# Patient Record
Sex: Female | Born: 1975
Health system: Southern US, Community
[De-identification: ages and names within clinical notes are randomized; demographics above are authoritative.]

## PROBLEM LIST (undated history)

## (undated) DIAGNOSIS — N63 Unspecified lump in unspecified breast: Secondary | ICD-10-CM

## (undated) DIAGNOSIS — N6019 Diffuse cystic mastopathy of unspecified breast: Secondary | ICD-10-CM

## (undated) HISTORY — DX: Unspecified lump in unspecified breast: N63.0

## (undated) HISTORY — DX: Diffuse cystic mastopathy of unspecified breast: N60.19

---

## 2001-10-27 HISTORY — PX: OTHER SURGICAL HISTORY: SHX169

## 2001-10-27 HISTORY — PX: LASIK: SHX215

## 2005-09-15 ENCOUNTER — Observation Stay: Payer: Self-pay | Admitting: Obstetrics & Gynecology

## 2005-10-06 ENCOUNTER — Inpatient Hospital Stay: Payer: Self-pay

## 2007-10-16 ENCOUNTER — Inpatient Hospital Stay: Payer: Self-pay

## 2010-10-27 DIAGNOSIS — N63 Unspecified lump in unspecified breast: Secondary | ICD-10-CM

## 2010-10-27 HISTORY — DX: Unspecified lump in unspecified breast: N63.0

## 2011-04-03 ENCOUNTER — Ambulatory Visit: Payer: Self-pay | Admitting: General Surgery

## 2013-01-26 ENCOUNTER — Inpatient Hospital Stay: Payer: Self-pay

## 2013-01-26 LAB — CBC WITH DIFFERENTIAL/PLATELET
Eosinophil #: 0.1 10*3/uL (ref 0.0–0.7)
Eosinophil %: 0.8 %
HCT: 34.2 % — ABNORMAL LOW (ref 35.0–47.0)
HGB: 11.4 g/dL — ABNORMAL LOW (ref 12.0–16.0)
Lymphocyte #: 1.2 10*3/uL (ref 1.0–3.6)
Lymphocyte %: 16 %
MCH: 30.2 pg (ref 26.0–34.0)
MCHC: 33.5 g/dL (ref 32.0–36.0)
MCV: 90 fL (ref 80–100)
Monocyte #: 0.7 x10 3/mm (ref 0.2–0.9)
Monocyte %: 10.1 %
Neutrophil #: 5.2 10*3/uL (ref 1.4–6.5)
Neutrophil %: 72 %
RBC: 3.79 10*6/uL — ABNORMAL LOW (ref 3.80–5.20)
RDW: 13.2 % (ref 11.5–14.5)

## 2013-01-27 LAB — HEMATOCRIT: HCT: 26 % — ABNORMAL LOW (ref 35.0–47.0)

## 2013-02-07 ENCOUNTER — Encounter: Payer: Self-pay | Admitting: General Surgery

## 2013-03-31 ENCOUNTER — Ambulatory Visit: Payer: Self-pay | Admitting: General Surgery

## 2013-04-04 ENCOUNTER — Ambulatory Visit: Payer: Self-pay | Admitting: General Surgery

## 2013-05-04 ENCOUNTER — Encounter: Payer: Self-pay | Admitting: *Deleted

## 2014-10-27 HISTORY — PX: OTHER SURGICAL HISTORY: SHX169

## 2015-01-03 ENCOUNTER — Ambulatory Visit: Payer: BLUE CROSS/BLUE SHIELD

## 2015-01-03 ENCOUNTER — Encounter: Payer: Self-pay | Admitting: General Surgery

## 2015-01-03 ENCOUNTER — Ambulatory Visit (INDEPENDENT_AMBULATORY_CARE_PROVIDER_SITE_OTHER): Payer: BLUE CROSS/BLUE SHIELD | Admitting: General Surgery

## 2015-01-03 VITALS — BP 118/78 | HR 68 | Resp 12 | Ht 62.5 in | Wt 116.0 lb

## 2015-01-03 DIAGNOSIS — N63 Unspecified lump in unspecified breast: Secondary | ICD-10-CM

## 2015-01-03 NOTE — Progress Notes (Signed)
Patient ID: Amanda Graham, female   DOB: May 25, 1976, 39 y.o.   MRN: [id number]  Chief Complaint  Patient presents with  . Follow-up    breast     HPI Amanda Graham is a 39 y.o. female here today for a follow up breast check. Patient missed her appointment in 2014 -had a pregnancy and was uneventful.  She states she noticed a lump in the left breast on her breast exam in the beginning of the month. The area has gotten smaller since she noticed it. She has some tenderness to the touch. She also has decided to have breast augmentation done in the near future.  Pt has had prior breast cyst removed from left breast near nipple.  HPI  Past Medical History  Diagnosis Date  . Diffuse cystic mastopathy   . Lump or mass in breast 2012    Right breast    Past Surgical History  Procedure Laterality Date  . [place] gland  2003    I&D  . Lasik  2003    History reviewed. No pertinent family history.  Social History History  Substance Use Topics  . Smoking status: Never Smoker   . Smokeless tobacco: Never Used  . Alcohol Use: 0.0 oz/week    0 Standard drinks or equivalent per week    No Known Allergies  No current outpatient prescriptions on file.   No current facility-administered medications for this visit.    Review of Systems Review of Systems  Constitutional: Negative.   Respiratory: Negative.   Cardiovascular: Negative.     Blood pressure 118/78, pulse 68, resp. rate 12, height 5' 2.5" (1.588 m), weight 116 lb (52.617 kg).  Physical Exam Physical Exam  Constitutional: She is oriented to person, place, and time. She appears well-developed and well-nourished.  Eyes: Conjunctivae are normal. No scleral icterus.  Neck: Neck supple. No thyromegaly present.  Cardiovascular: Normal rate, regular rhythm and normal heart sounds.   No murmur heard. Pulmonary/Chest: Effort normal and breath sounds normal. Right breast exhibits no inverted nipple, no mass, no nipple discharge, no  skin change and no tenderness. Left breast exhibits mass (1 cm 8 o'clock 2 cm from nipple. ). Left breast exhibits no inverted nipple, no nipple discharge, no skin change and no tenderness.  Lymphadenopathy:    She has no cervical adenopathy.    She has no axillary adenopathy.  Neurological: She is alert and oriented to person, place, and time.  Skin: Skin is warm and dry.  The mass in left breast feels more  Like a fibrotic ridge.   Data Reviewed Targeted US of the mass shows no finding.  Assessment    Left breast mass 1 cm in size 8 o'clock 2 cm from nipple- likely area of fibrosis     Plan    Patient to return in 2-3weeks for a follow up. She is to be scheduled for a bilateral screening mammogram at this time.     cc: Amanda Pink, MD, Amanda Graham, CNM   [facility] G 01/03/2015, 11:06 AM

## 2015-01-03 NOTE — Patient Instructions (Signed)
Patient to return in 1 month for follow up. She is also to be arranged for a bilateral screening mammogram at this time. Continue self breast exams. Call office for any new breast issues or concerns.

## 2015-01-10 ENCOUNTER — Ambulatory Visit: Payer: Self-pay | Admitting: General Surgery

## 2015-01-10 ENCOUNTER — Encounter: Payer: Self-pay | Admitting: General Surgery

## 2015-01-17 ENCOUNTER — Ambulatory Visit: Payer: BLUE CROSS/BLUE SHIELD | Admitting: General Surgery

## 2015-01-17 ENCOUNTER — Ambulatory Visit (INDEPENDENT_AMBULATORY_CARE_PROVIDER_SITE_OTHER): Payer: BLUE CROSS/BLUE SHIELD | Admitting: General Surgery

## 2015-01-17 ENCOUNTER — Encounter: Payer: Self-pay | Admitting: General Surgery

## 2015-01-17 VITALS — BP 118/62 | HR 70 | Resp 12 | Ht 62.0 in | Wt 117.0 lb

## 2015-01-17 DIAGNOSIS — N63 Unspecified lump in unspecified breast: Secondary | ICD-10-CM

## 2015-01-17 NOTE — Progress Notes (Signed)
Patient ID: Amanda Graham, female   DOB: 1976-06-07, 39 y.o.   MRN: [id number]  Chief Complaint  Patient presents with  . Follow-up    mammogram    HPI Amanda Graham is a 39 y.o. female who presents for a follow up after mammogram on 01/10/15. She states last week the left breast was a little sore but now it is "fine".   She was noted to have a 1 cm mass in the left breast recently and the ultrasound was normal.  HPI  Past Medical History  Diagnosis Date  . Diffuse cystic mastopathy   . Lump or mass in breast 2012    Right breast    Past Surgical History  Procedure Laterality Date  . [place] gland  2003    I&D  . Lasik  2003    History reviewed. No pertinent family history.  Social History History  Substance Use Topics  . Smoking status: Never Smoker   . Smokeless tobacco: Never Used  . Alcohol Use: 0.0 oz/week    0 Standard drinks or equivalent per week    No Known Allergies  Current Outpatient Prescriptions  Medication Sig Dispense Refill  . ibuprofen (ADVIL,MOTRIN) 200 MG tablet Take 200 mg by mouth every 6 (six) hours as needed.     No current facility-administered medications for this visit.    Review of Systems Review of Systems  Constitutional: Negative.   Respiratory: Negative.   Cardiovascular: Negative.     Blood pressure 118/62, pulse 70, resp. rate 12, height 5\' 2"  (1.575 m), weight 117 lb (53.071 kg).  Physical Exam Physical Exam  Constitutional: She is oriented to person, place, and time. She appears well-developed and well-nourished.  Eyes: Conjunctivae are normal. No scleral icterus.  Neck: Neck supple.  Pulmonary/Chest: Right breast exhibits mass. Right breast exhibits no inverted nipple, no nipple discharge, no skin change and no tenderness.  1 cm mass 8 o'clock left breast 2 CFN.  Lymphadenopathy:    She has no axillary adenopathy.  Neurological: She is alert and oriented to person, place, and time.  Skin: Skin is warm and dry.     Data Reviewed Mammogram reviewed and is normal. Assessment    1 cm mass 8 o'clock left breast 2 CFN. Given the fact that the patient is going to have implants, feel it is reasonable to remove the lump prior to the implants. Reasons exlained. Discussed fully with patient and she is agreeable. Procedure can be done in office setting.    Plan    Recommend excision of the left breast mass, patient agrees.       Amanda Graham 01/17/2015, 11:24 AM

## 2015-01-17 NOTE — Patient Instructions (Signed)
The patient is aware to call back for any questions or concerns.  

## 2015-01-25 ENCOUNTER — Encounter: Payer: Self-pay | Admitting: General Surgery

## 2015-01-25 ENCOUNTER — Ambulatory Visit (INDEPENDENT_AMBULATORY_CARE_PROVIDER_SITE_OTHER): Payer: BLUE CROSS/BLUE SHIELD | Admitting: General Surgery

## 2015-01-25 VITALS — BP 110/68 | HR 78 | Resp 12 | Ht 62.0 in | Wt 117.0 lb

## 2015-01-25 DIAGNOSIS — N6320 Unspecified lump in the left breast, unspecified quadrant: Secondary | ICD-10-CM

## 2015-01-25 DIAGNOSIS — N63 Unspecified lump in breast: Secondary | ICD-10-CM

## 2015-01-25 NOTE — Progress Notes (Signed)
Patient ID: Amanda Graham, female   DOB: 1976-03-22, 39 y.o.   MRN: [id number]  Chief Complaint  Patient presents with  . Procedure    left breast excision     HPI Amanda Graham is a 39 y.o. female here today for a left breast excision.  HPI  Past Medical History  Diagnosis Date  . Diffuse cystic mastopathy   . Lump or mass in breast 2012    Right breast    Past Surgical History  Procedure Laterality Date  . [place] gland  2003    I&D  . Lasik  2003    History reviewed. No pertinent family history.  Social History History  Substance Use Topics  . Smoking status: Never Smoker   . Smokeless tobacco: Never Used  . Alcohol Use: 0.0 oz/week    0 Standard drinks or equivalent per week    No Known Allergies  Current Outpatient Prescriptions  Medication Sig Dispense Refill  . ibuprofen (ADVIL,MOTRIN) 200 MG tablet Take 200 mg by mouth every 6 (six) hours as needed.     No current facility-administered medications for this visit.    Review of Systems Review of Systems  Constitutional: Negative.   Respiratory: Negative.   Cardiovascular: Negative.     Blood pressure 110/68, pulse 78, resp. rate 12, height 5\' 2"  (1.575 m), weight 117 lb (53.071 kg).  Physical Exam Physical Exam Left breast mass is again felt-8 ocl location. It did appear to be more oblong-basically unchanged.  Data Reviewed Prior notes  Assessment    Left breast mass     Plan    Excision performed as planned     Anesthetic-7ml of 1% xylocaine mixed with 0.5% marcaine Prep: chloro prep Left nipple area was prepped and draped. Circumareolar incision made 7-9ocl. Palpable mass 1.5cm was excised out.-gross exam is benign. Bleeding controlled with cautery. Deep tissue closed with 3-0 Vicryl Skin closed with 4-0 Vicryl. Dermabond applied.  No immediate  Problems from procedure.  Recheck in 2 weeks.    SANKAR,SEEPLAPUTHUR G 01/25/2015, 10:08 AM

## 2015-01-30 ENCOUNTER — Telehealth: Payer: Self-pay | Admitting: *Deleted

## 2015-01-30 NOTE — Telephone Encounter (Signed)
Notified patient as instructed, patient pleased. Discussed follow-up appointments, patient agrees  

## 2015-02-12 ENCOUNTER — Encounter: Payer: Self-pay | Admitting: General Surgery

## 2015-02-12 ENCOUNTER — Ambulatory Visit (INDEPENDENT_AMBULATORY_CARE_PROVIDER_SITE_OTHER): Payer: BLUE CROSS/BLUE SHIELD | Admitting: General Surgery

## 2015-02-12 VITALS — BP 116/68 | HR 66 | Resp 12 | Ht 62.0 in

## 2015-02-12 DIAGNOSIS — N6320 Unspecified lump in the left breast, unspecified quadrant: Secondary | ICD-10-CM

## 2015-02-12 DIAGNOSIS — N63 Unspecified lump in breast: Secondary | ICD-10-CM

## 2015-02-12 NOTE — Progress Notes (Signed)
Patient ID: Amanda Graham, female   DOB: 1976-05-31, 39 y.o.   MRN: [id number] Here for follow up from left breast mass excision. She is doing well but does state that she had a lot of sensitivity at first but this has disappeared.   Pathology was consistent with fibrocystic changes.  Exam shows a well healed incision in left breast.  She is ok to proceed with breast reconstruction. Follow up in one year.    PCP: Dr Maryland Pink

## 2015-02-25 HISTORY — PX: AUGMENTATION MAMMAPLASTY: SUR837

## 2015-02-25 HISTORY — PX: BREAST SURGERY: SHX581

## 2015-03-06 NOTE — H&P (Signed)
L&D Evaluation:  History:  HPI 39 year old G4 P3003 with EDC=01/23/2013 by LMP=04/18/2012 presents at 40 3/7 weeks for an IOL. Has been having irregular ctxs. No LOF or VB. Baby active. Prenatal care at [facility] remarkable for [place] (had a negative Harmony test, a negative MSAFP,  and normal anatomy scan), a UTI in the third trimester, and a positive GBS culture. Past history remarkable for macrosomic infants with a mild shoulder dystocia with her last baby (9#9oz). LABS:   Presents with for IOL   Patient's Medical History Fibrocystic breasts   Patient's Surgical History I&D of Bartholins abscess, bx left breast, Lasik, wisdom teeth extraction   Medications Multivitamin and super B complex vitamin   Allergies NKDA   Social History none   Family History Non-Contributory   ROS:  ROS All systems were reviewed.  HEENT, CNS, GI, GU, Respiratory, CV, Renal and Musculoskeletal systems were found to be normal.   Exam:  Vital Signs stable   Urine Protein not completed   General no apparent distress   Mental Status clear   Chest clear   Heart normal sinus rhythm, no murmur/gallop/rubs   Abdomen gravid, non-tender   Estimated Fetal Weight Large for gestational age, 9#   Fetal Position cephalic   Edema no edema   Reflexes 1+   Pelvic no external lesions, 3.5/60-70%/-1   Mebranes Intact   FHT normal rate with no decels, 140 with accels   FHT Description mod variability   Fetal Heart Rate 145   Ucx irregular, mild q2-6 min apart   Skin dry   Impression:  Impression IUP at 40 3/7 weeks with ripe cervix for IOL   Plan:  Plan EFM/NST, monitor contractions and for cervical change, antibiotics for GBBS prophylaxis, Plan an amniotomy induction after GBS prophyllaxis begun.   Electronic Signatures: Karene Fry (CNM)  (Signed 02-Apr-14 09:51)  Authored: L&D Evaluation   Last Updated: 02-Apr-14 09:51 by Karene Fry (CNM)

## 2015-11-08 ENCOUNTER — Ambulatory Visit: Payer: BLUE CROSS/BLUE SHIELD

## 2015-11-08 ENCOUNTER — Encounter: Payer: Self-pay | Admitting: General Surgery

## 2015-11-08 ENCOUNTER — Ambulatory Visit (INDEPENDENT_AMBULATORY_CARE_PROVIDER_SITE_OTHER): Payer: BLUE CROSS/BLUE SHIELD | Admitting: General Surgery

## 2015-11-08 VITALS — BP 128/80 | HR 74 | Resp 12 | Ht 63.0 in | Wt 120.0 lb

## 2015-11-08 DIAGNOSIS — N6459 Other signs and symptoms in breast: Secondary | ICD-10-CM | POA: Diagnosis not present

## 2015-11-08 DIAGNOSIS — N63 Unspecified lump in breast: Secondary | ICD-10-CM | POA: Diagnosis not present

## 2015-11-08 DIAGNOSIS — N6310 Unspecified lump in the right breast, unspecified quadrant: Secondary | ICD-10-CM

## 2015-11-08 NOTE — Patient Instructions (Addendum)
The patient is aware to call back for any questions or concerns. Follow up in April.

## 2015-11-08 NOTE — Progress Notes (Signed)
Patient ID: Amanda Graham, female   DOB: [birth year], 40 y.o.   MRN: [id number]  Chief Complaint  Patient presents with  . Breast Problem    right breast    HPI Amanda Graham is a 40 y.o. female.  Here today for evaluation of a right breast mass near the 10 o'clk area. This was found by the patient just before 10/20/15, and later confirmed on clinical exam. She states that the area is slightly tender to touch. She denies any nipple discharge. Since her last visit she has undergone bilateral breast augmentation with implants.  I have reviewed the history of present illness with the patient.   HPI  Past Medical History  Diagnosis Date  . Diffuse cystic mastopathy   . Lump or mass in breast 2012    Right breast    Past Surgical History  Procedure Laterality Date  . [place] gland  2003    I&D  . Lasik  2003  . Breast surgery Bilateral 05/2015    breast augmentation with implants. Dr Lanny Cramp    Family History  Problem Relation Age of Onset  . Breast cancer Paternal Aunt   . Colon cancer      Maternal Great Aunt    Social History Social History  Substance Use Topics  . Smoking status: Never Smoker   . Smokeless tobacco: Never Used  . Alcohol Use: 0.0 oz/week    0 Standard drinks or equivalent per week    No Known Allergies  Current Outpatient Prescriptions  Medication Sig Dispense Refill  . ibuprofen (ADVIL,MOTRIN) 200 MG tablet Take 200 mg by mouth every 6 (six) hours as needed.     No current facility-administered medications for this visit.    Review of Systems Review of Systems  Constitutional: Negative.   Respiratory: Negative.   Cardiovascular: Negative.     Blood pressure 128/80, pulse 74, resp. rate 12, height 5\' 3"  (1.6 m), weight 120 lb (54.432 kg).  Physical Exam Physical Exam  Constitutional: She is oriented to person, place, and time. She appears well-developed and well-nourished.  Eyes: Conjunctivae are normal. No scleral icterus.  Neck: Neck supple.   Pulmonary/Chest: Right breast exhibits no inverted nipple, no mass, no nipple discharge, no skin change and no tenderness. Left breast exhibits no inverted nipple, no mass, no nipple discharge, no skin change and no tenderness.  Right breast ill defined thickening in the axillary tail area.   Lymphadenopathy:    She has no cervical adenopathy.    She has no axillary adenopathy.  Neurological: She is alert and oriented to person, place, and time.  Skin: Skin is warm and dry.  Psychiatric: She has a normal mood and affect.    Data Reviewed Ultrasound targeted over the palpable thickening in right breast shows no abnormality  Assessment    Right breast ill defined thickening in the axillary tail area. Likely this is benign thickening of the breast tissue in the axillary tail.     Plan    Follow up in April as scheduled or sooner if the area enlarges     This has been scribed by Lesly Rubenstein LPN  PCP: Maryland Pink Ref: Rhodia Albright 11/09/2015, 10:15 AM

## 2015-11-09 ENCOUNTER — Encounter: Payer: Self-pay | Admitting: General Surgery

## 2015-12-26 HISTORY — PX: BREAST SURGERY: SHX581

## 2015-12-27 ENCOUNTER — Encounter: Payer: Self-pay | Admitting: Physical Therapy

## 2015-12-27 ENCOUNTER — Ambulatory Visit: Payer: BLUE CROSS/BLUE SHIELD | Attending: Certified Nurse Midwife | Admitting: Physical Therapy

## 2015-12-27 DIAGNOSIS — N8184 Pelvic muscle wasting: Secondary | ICD-10-CM | POA: Insufficient documentation

## 2015-12-27 DIAGNOSIS — M62838 Other muscle spasm: Secondary | ICD-10-CM | POA: Insufficient documentation

## 2015-12-27 DIAGNOSIS — M533 Sacrococcygeal disorders, not elsewhere classified: Secondary | ICD-10-CM | POA: Diagnosis not present

## 2015-12-27 DIAGNOSIS — M6289 Other specified disorders of muscle: Secondary | ICD-10-CM

## 2015-12-27 NOTE — Patient Instructions (Addendum)
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   Rock pelvic forward (weight is more on thighs), backward  (weight is more on tailbone) and then find to neutral  (notice you are seated on your sitting bones).   STRETCHES: long even breaths, no rushing Frog stretch: On belly, elbows bent, forehead resting on palms  Knee bent, inhale do nothing Exhale, release heels apart 45 deg, notice gentle release of muscles in lower buttocks 10 x 2 sets, 3 x day   childs pose rocking: On hands a knees, inhale Exhale, move hips back towards heels, lengthening your back and feel pelvic floor muscles relax  10 x 2 sets, 3x day    [place] core ab exercises, weight lifting temporarily until guided.
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
## 2015-12-31 ENCOUNTER — Ambulatory Visit: Payer: BLUE CROSS/BLUE SHIELD | Admitting: Physical Therapy

## 2015-12-31 DIAGNOSIS — M62838 Other muscle spasm: Secondary | ICD-10-CM

## 2015-12-31 DIAGNOSIS — M6289 Other specified disorders of muscle: Secondary | ICD-10-CM

## 2015-12-31 DIAGNOSIS — M533 Sacrococcygeal disorders, not elsewhere classified: Secondary | ICD-10-CM

## 2016-01-01 NOTE — Therapy (Signed)
[place] MAIN [facility] SERVICES [address] [place], [place], [address] Phone: [phone]   Fax:  [phone]  Physical Therapy Treatment  Patient Details  Name: Amanda Graham MRN: [id number] Date of Birth: Feb 27, 1976 Referring Provider: Andres Labrum MD   Encounter Date: 12/31/2015      PT End of Session - 01/01/16 2325    Visit Number 2   Number of Visits 12   Date for PT Re-Evaluation 03/14/16   PT Start Time 1500   PT Stop Time 1610   PT Time Calculation (min) 70 min      Past Medical History  Diagnosis Date  . Diffuse cystic mastopathy   . Lump or mass in breast 2012    Right breast    Past Surgical History  Procedure Laterality Date  . [place] gland  2003    I&D  . Lasik  2003  . Breast benign lumps removed  Left 2016   . Breast surgery Bilateral 05/2015    breast augmentation with implants. Dr Lanny Cramp    There were no vitals filed for this visit.  Visit Diagnosis:  Coccyx pain  Pelvic floor dysfunction  Muscle spasm      Subjective Assessment - 01/01/16 2311    Subjective Pt feels 90% improvement with her coccyx pain with ability to sit through a movie in the theatre and experiencing decreased pain with sitting in her car. Pt remains complinat with HEP.   Pertinent History four vaginal deliveries, perineal tears and pelvic girdle pain with 3rd child. Hx of Bartholin Gland removal.  Hx of B knee pain with running. Pt would like to return to trail running.    Patient Stated Goals be normal and return to exercise with confidence, no lakage no tailbone pain             [facility] PT Assessment - 01/01/16 2313    Observation/Other Assessments   Observations good carry over with pelvic tilting in sitting, demo'd standing posture with pelvic neutral, increased lumbar lordosis   Other:   Other/ Comments superman ex with excessive lumbar lordosis   Palpation   Spinal mobility increased thoracic tensions and hypomobility    SI assessment  decreased fascial mobility over sacrum     Palpation comment coccyx more medially aligned, less fllexed with minimal tenderness and no mm tensions B. Noted slight fascial restrictions along lateral border of sacrum at S2-3, and scarotuberous ligament B                   Pelvic Floor Special Questions - 01/01/16 2316    Pelvic Floor Internal Exam pt consented verbally without contraindications   Exam Type Vaginal   Palpation increased mm tensions B ischiooccygeus/ coccgyeus, obt int .  perineal scar noted R    Biofeedback imagery. tactile cuing for pelvic floor ROM           [facility] Adult PT Treatment/Exercise - 01/01/16 2316    Neuro Re-ed    Neuro Re-ed Details  deep core level 1-2, pelvic floor ROM coordination   superwomen ex with thoracic ext / scapular stabilization   Manual Therapy   Manual therapy comments pelvic floor manual Tx thiele and sustained pressure B, myofascial releaseover sacrum, thoracic release (Grade II CVJ, paraspinal mm STM,    Grade III T1-10 along SP    Kinesiotix   Ligament Correction 3 -fanned pattern from coccyx  PT Education - 01/01/16 2324    Education provided Yes   Education Details HEP, motor control principles   Person(s) Educated Patient   Methods Explanation;Demonstration;Tactile cues;Verbal cues;Handout   Comprehension Returned demonstration;Verbalized understanding             PT Long Term Goals - 01/01/16 2328    PT LONG TERM GOAL #1   Title Pt will decrease her PFDI score from 55% to < 35% in order to demo improved pelvic floor function for ADLs.    Time 12   Period Weeks   Status On-going   PT LONG TERM GOAL #2   Title Pt will demo decreased tenderness/tensions of coccygeus mm bilaterally in order to return to sitting in the car for > 1hr with < 2/10 pain.    Time 12   Period Weeks   Status Achieved   PT LONG TERM GOAL #3   Title Pt will demo proper sitting posture with  neutral pelvic tilt across 2 visits without cuing in order to sit on sofa without pain.   Time 12   Period Weeks   Status Partially Met   PT LONG TERM GOAL #4   Title Pt will report decreased urinary leakage by 50% with coughing, sneezing, laughing in order to improve QOL.    Time 12   Period Weeks   Status On-going   PT LONG TERM GOAL #5   Title Pt will report ability to initiate urination without delay and ability to completely empty across 1 week in order to minimize pelvic floor strain.   Time 12   Period Weeks   Status On-going               Plan - 01/01/16 2325    Clinical Impression Statement Pt is progressing well with 90% improvement with coccyx pain. Pt was able to tolerate sitting through a movie and her car t/f have been less painful.  Pt showed good carry over from last Tx as she demo'd significantly less flexed and deviated coccyx with decreased coccygeus mm. Pt also demo'd good carry with motor control of pelvis to promote stability.  Pt demo'd increased pelvic floor mm tensions and perineal scar restriction which were decreased after manual Tx.Pt had no complaints from Tx. Applied kinsiotape over sacrum to promote sacral nutation. Initiated deep core level 1-2 , prone heel press  in order to address postural stability. At the next session, plan to continue educating pt on proper modifications in self selected fitness exercises and to address her difficulty with finding comfort in laying supine. Pt would benefit from skilled PT.     Pt will benefit from skilled therapeutic intervention in order to improve on the following deficits Abnormal gait;Pain;Improper body mechanics;Hypermobility;Decreased activity tolerance;Decreased balance;Decreased endurance;Decreased range of motion;Decreased strength;Difficulty walking;Impaired flexibility;Postural dysfunction;Increased muscle spasms;Decreased coordination;Decreased safety awareness   Rehab Potential Good   PT Frequency 2x /  week   PT Duration 12 weeks   PT Treatment/Interventions ADLs/Self Care Home Management;Aquatic Therapy;Biofeedback;Cryotherapy;Electrical Stimulation;Gait training;Traction;Moist Heat;Functional mobility Scientist, forensic;Therapeutic activities;Therapeutic exercise;Balance training;Neuromuscular re-education;Manual techniques;Patient/family education;Dry needling;Energy conservation;Taping   Consulted and Agree with Plan of Care Patient        Problem List There are no active problems to display for this patient.   Jerl Mina ,PT, DPT, E-RYT  01/01/2016, 11:32 PM  [place] MAIN [facility] SERVICES [address] [place], [place], [address] Phone: [phone]   Fax:  [phone]  Name: Amanda Graham MRN: [id number]  Date of Birth: Aug 14, 1976

## 2016-01-01 NOTE — Patient Instructions (Signed)
You are now ready to begin training the deep core muscles system: diaphragm, transverse abdominis, pelvic floor . These muscles must work together as a team.           The key to these exercises to train the brain to coordinate the timing of these muscles and to have them turn on for long periods of time to hold you upright against gravity (especially important if you are on your feet all day).These muscles are postural muscles and play a role stabilizing your spine and bodyweight. By doing these repetitions slowly and correctly instead of doing crunches, you will achieve a flatter belly without a lower pooch. You are also placing your spine in a more neutral position and breathing properly which in turn, decreases your risk for problems related to your pelvic floor, abdominal, and low back such as pelvic organ prolapse, hernias, diastasis recti (separation of superficial muscles), disk herniations, spinal fractures. These exercises set a solid foundation for you to later progress to resistance/ strength training with therabands and weights and return to other typical fitness exercises with a stronger deeper core.     Prone Heel Press for strengthening sacro-iliac joints  1. Lie on your belly. If you have an arch in your low back or it feels umcomfortable, place a pillow under your low belly/hips to make sure your low back feel comfortable.   2. Place our forehead on top of your palms.      Widen your knees apart for starting position.   3. Inhale, feel belly and low back expand  4. Exhale, feel belly hug in, press heel together and count aloud for 5 sec. Then relax the heel squeezing.  Perform 10 reps of 5 sec holds. 2 sets/ day.    If you feel entire buttock tighten too much or feel low back pain, apply 50% less effort. As you press your heel together, you will feel as if your pubic bone (front of your pelvis) and sacrum (back of your pelvis) gentle move towards each other or your low  abdominal muscles hug in more.        Superwomen with emphasis on scapular stabilization and thoracic extensions not lumbar ext

## 2016-01-03 ENCOUNTER — Ambulatory Visit: Payer: BLUE CROSS/BLUE SHIELD | Admitting: Physical Therapy

## 2016-01-03 DIAGNOSIS — M533 Sacrococcygeal disorders, not elsewhere classified: Secondary | ICD-10-CM

## 2016-01-03 DIAGNOSIS — M62838 Other muscle spasm: Secondary | ICD-10-CM

## 2016-01-03 DIAGNOSIS — M6289 Other specified disorders of muscle: Secondary | ICD-10-CM

## 2016-01-03 NOTE — Patient Instructions (Signed)
   Strengthening:  1. Prone heel "Dorothy"  2. Super woman  3. Deep core level 2 and 3    4.  "Jelly Fish hold"    PELVIC FLOOR / KEGEL EXERCISES   Pelvic floor/ Kegel exercises are used to strengthen the muscles in the base of your pelvis that are responsible for supporting your pelvic organs and preventing urine/feces leakage. Based on your therapist's recommendations, they can be performed while standing, sitting, or lying down. Imagine pelvic floor area as a diamond with pelvic landmarks: top =pubic bone, bottom tip=tailbone, sides=sitting bones (ischial tuberosities).    Make yourself aware of this muscle group by using these cues while coordinating your breath:  Inhale, feel pelvic floor diamond area lower like hammock towards your feet and ribcage/belly expanding. Pause. Let the exhale naturally and feel your belly sink, abdominal muscles hugging in around you and you may notice the pelvic diamond draws upward towards your head forming a umbrella shape. Give a squeeze during the exhalation like you are stopping the flow of urine. If you are squeezing the buttock muscles, try to give 50% less effort.   Common Errors:  Breath holding: If you are holding your breath, you may be bearing down against your bladder instead of pulling it up. If you belly bulges up while you are squeezing, you are holding your breath. Be sure to breathe gently in and out while exercising. Counting out loud may help you avoid holding your breath.  Accessory muscle use: You should not see or feel other muscle movement when performing pelvic floor exercises. When done properly, no one can tell that you are performing the exercises. Keep the buttocks, belly and inner thighs relaxed.  Overdoing it: Your muscles can fatigue and stop working for you if you over-exercise. You may actually leak more or feel soreness at the lower abdomen or rectum.  YOUR HOME EXERCISE PROGRAM  LONG HOLDS: Position: on  back  Inhale and then exhale. Then squeeze the muscle and count aloud for 10 seconds. Rest with three long breaths. (Be sure to let belly sink in with exhales and not push outward)  Perform 4 repetitions, 3 times/day  SHORT HOLDS: Position: on back, sitting   Inhale and then exhale. Then squeeze the muscle.  (Be sure to let belly sink in with exhales and not push outward)  Perform 5 repetitions, 5  Times/day                      DECREASE DOWNWARD PRESSURE ON  YOUR PELVIC FLOOR, ABDOMINAL, LOW BACK MUSCLES       PRESERVE YOUR PELVIC HEALTH LONG-TERM   ** SQUEEZE pelvic floor BEFORE YOUR SNEEZE, COUGH, LAUGH   ** EXHALE BEFORE YOU RISE AGAINST GRAVITY (lifting, sit to stand, from squat to stand)   ** LOG ROLL OUT OF BED INSTEAD OF CRUNCH/SIT-UP   _________________________   Stretches:  Open book (midback mobility)  Frog Child pose

## 2016-01-04 NOTE — Therapy (Signed)
[place] MAIN [facility] SERVICES [address] [place], [place], [address] Phone: [phone]   Fax:  [phone]  Physical Therapy Treatment  Patient Details  Name: Amanda Graham MRN: [id number] Date of Birth: 09/08/1976 Referring Provider: Andres Labrum MD   Encounter Date: 01/03/2016      PT End of Session - 01/03/16 1407    Visit Number 3   Number of Visits 12   Date for PT Re-Evaluation 03/14/16   PT Start Time 1300   PT Stop Time 1400   PT Time Calculation (min) 60 min   Activity Tolerance Patient tolerated treatment well;No increased pain   Behavior During Therapy [facility] for tasks assessed/performed      Past Medical History  Diagnosis Date  . Diffuse cystic mastopathy   . Lump or mass in breast 2012    Right breast    Past Surgical History  Procedure Laterality Date  . [place] gland  2003    I&D  . Lasik  2003  . Breast benign lumps removed  Left 2016   . Breast surgery Bilateral 05/2015    breast augmentation with implants. Dr Lanny Cramp    There were no vitals filed for this visit.  Visit Diagnosis:  Coccyx pain  Pelvic floor dysfunction  Muscle spasm      Subjective Assessment - 01/03/16 1408    Subjective Pt reported not noticing her coccyx pain for 2 days after her last session but she noticed her tailbone pain today. Pt was unsure if she was going her HEP correctly. Pt also wondered if she needed to resume her pelvic floor stretches. Pt reported she did alot of physical work with floor to rise, and lifting as well while preparing her house to go on the market.    Pertinent History four vaginal deliveries, perineal tears and pelvic girdle pain with 3rd child. Hx of Bartholin Gland removal.  Hx of B knee pain with running. Pt would like to return to trail running.    Patient Stated Goals be normal and return to exercise with confidence, no lakage no tailbone pain             [facility] PT Assessment - 01/04/16 [id number]    Coordination   Gross Motor Movements are Fluid and Coordinated --  required cuing for scap retraction/depression   Palpation   Spinal mobility decreased midback tensions and more mobility over thoracic segments   SI assessment  increased fascial mobility over sacrum    Palpation comment coccyx less flexed,, tenderness noted at R coccygeus mm                   Pelvic Floor Special Questions - 01/04/16 [id number]    Pelvic Floor Internal Exam pt consented verbally without contraindications   Exam Type Vaginal   Palpation no mm tensions noted, able to elicit normal ROM and Grade 4 contraction    Strength good squeeze, good lift, able to hold agaisnt strong resistance   Strength # of reps 4   Strength # of seconds 10   Biofeedback --           [facility] Adult PT Treatment/Exercise - 01/04/16 0840    Therapeutic Activites    Other Therapeutic Activities running mechanics on treadmill and education on warm up and cool down    Neuro Re-ed    Neuro Re-ed Details  cued for running mechanics: deep core engagement, more anterior COM and forefoot strike   reviewed  HEP, pelvic floor endurance training   Manual Therapy   Manual therapy comments sustained pressure externally on R coccygeus, gentle mobilization internally and externally in supine for coccyx mobility.                 PT Education - 01/04/16 0834    Education provided Yes   Education Details HEP   Person(s) Educated Patient   Methods Explanation;Demonstration;Tactile cues;Verbal cues;Handout   Comprehension Verbalized understanding;Returned demonstration;Verbal cues required;Tactile cues required             PT Long Term Goals - 01/04/16 0835    PT LONG TERM GOAL #1   Title Pt will decrease her PFDI score from 55% to < 35% in order to demo improved pelvic floor function for ADLs.    Time 12   Period Weeks   Status On-going   PT LONG TERM GOAL #2   Title Pt will demo decreased tenderness/tensions of  coccygeus mm bilaterally in order to return to sitting in the car for > 1hr with < 2/10 pain.    Time 12   Period Weeks   Status Achieved   PT LONG TERM GOAL #3   Title Pt will demo proper sitting posture with neutral pelvic tilt across 2 visits without cuing in order to sit on sofa without pain.   Time 12   Period Weeks   Status Achieved   PT LONG TERM GOAL #4   Title Pt will report decreased urinary leakage by 50% with coughing, sneezing, laughing in order to improve QOL.    Time 12   Period Weeks   Status On-going   PT LONG TERM GOAL #5   Title Pt will report ability to initiate urination without delay and ability to completely empty across 1 week in order to minimize pelvic floor strain.   Time 12   Period Weeks   Status On-going   Additional Long Term Goals   Additional Long Term Goals Yes   PT LONG TERM GOAL #6   Title Pt will demo proper alignment and technique with fitness exercises in order to decrease relapse of Sx   Time 12   Period Weeks   Status New   PT LONG TERM GOAL #7   Title Pt will demo pelvic floor strength 4/7/7/7 in order to maintain pelvic girdle stability and decrease risk for pelvic floor dysfunction.    Time 12   Period Weeks   Status New               Plan - 01/04/16 0834    Clinical Impression Statement Pt continues to show less flexed coccyx and progressed to pelvic floor strengthening today. Pt showed less thoracic mm tensions and increase joint mobility but required cuing to decrease abdominal expansion and focus on diaphragmatic excursion. Pt will continue to benefit from skilled PT. Plan to address floor to rise body mechanics with deep core engagement and SIJ stability] at next session.    Pt will benefit from skilled therapeutic intervention in order to improve on the following deficits Abnormal gait;Pain;Improper body mechanics;Hypermobility;Decreased activity tolerance;Decreased balance;Decreased endurance;Decreased range of  motion;Decreased strength;Difficulty walking;Impaired flexibility;Postural dysfunction;Increased muscle spasms;Decreased coordination;Decreased safety awareness   Rehab Potential Good   PT Frequency 2x / week   PT Duration 12 weeks   PT Treatment/Interventions ADLs/Self Care Home Management;Aquatic Therapy;Biofeedback;Cryotherapy;Electrical Stimulation;Gait training;Traction;Moist Heat;Functional mobility Scientist, forensic;Therapeutic activities;Therapeutic exercise;Balance training;Neuromuscular re-education;Manual techniques;Patient/family education;Dry needling;Energy conservation;Taping   Consulted and Agree with Plan of Care Patient  Problem List There are no active problems to display for this patient.   Jerl Mina ,PT, DPT, E-RYT  01/04/2016, 8:44 AM  [place] MAIN [facility] SERVICES [address] [place], [place], [address] Phone: [phone]   Fax:  [phone]  Name: Amanda Graham MRN: [id number] Date of Birth: [birth year]

## 2016-01-07 ENCOUNTER — Encounter: Payer: BLUE CROSS/BLUE SHIELD | Admitting: Physical Therapy

## 2016-01-09 ENCOUNTER — Ambulatory Visit: Payer: BLUE CROSS/BLUE SHIELD | Admitting: Physical Therapy

## 2016-01-09 DIAGNOSIS — M62838 Other muscle spasm: Secondary | ICD-10-CM

## 2016-01-09 DIAGNOSIS — M6289 Other specified disorders of muscle: Secondary | ICD-10-CM

## 2016-01-09 DIAGNOSIS — M533 Sacrococcygeal disorders, not elsewhere classified: Secondary | ICD-10-CM

## 2016-01-09 NOTE — Therapy (Signed)
[place] MAIN [facility] SERVICES [address] [place], [place], [address] Phone: [phone]   Fax:  [phone]  Physical Therapy Treatment  Patient Details  Name: Amanda Graham MRN: [id number] Date of Birth: 06/13/1976 Referring Provider: Andres Labrum MD   Encounter Date: 01/09/2016      PT End of Session - 01/09/16 1618    Visit Number 4   Number of Visits 12   Date for PT Re-Evaluation 03/14/16   PT Start Time 1500   PT Stop Time 1600   PT Time Calculation (min) 60 min   Activity Tolerance Patient tolerated treatment well;No increased pain   Behavior During Therapy [facility] for tasks assessed/performed      Past Medical History  Diagnosis Date  . Diffuse cystic mastopathy   . Lump or mass in breast 2012    Right breast    Past Surgical History  Procedure Laterality Date  . [place] gland  2003    I&D  . Lasik  2003  . Breast benign lumps removed  Left 2016   . Breast surgery Bilateral 05/2015    breast augmentation with implants. Dr Lanny Cramp    There were no vitals filed for this visit.  Visit Diagnosis:  Coccyx pain  Pelvic floor dysfunction  Muscle spasm      Subjective Assessment - 01/09/16 1545    Subjective pt reported feeling soreness in her tailbone after sitting in her car without paying attention to posture.    Pertinent History four vaginal deliveries, perineal tears and pelvic girdle pain with 3rd child. Hx of [place] Gland removal.  Hx of B knee pain with running. Pt would like to return to trail running.    Patient Stated Goals be normal and return to exercise with confidence, no lakage no tailbone pain             [facility] PT Assessment - 01/09/16 1547    Coordination   Gross Motor Movements are Fluid and Coordinated --  mild cuing for bridging exercises with band combo UE strengt   Palpation   Palpation comment coccyx remained less flexed and in more normal position  soreness noted, not restrictions  noted                   Pelvic Floor Special Questions - 01/09/16 1548    Pelvic Floor Internal Exam pt consented verbally without contraindications   Exam Type Vaginal   Palpation no mm tensions noted, able to elicit normal ROM and Grade 4 contraction    Strength good squeeze, good lift, able to hold agaisnt strong resistance   Strength # of reps 5   Strength # of seconds 3   Biofeedback fatigued quickly due to pt reporting having performed her 2 sets of 10sec holds back to back. Educated pt on not performing  endurance exercises back and back  to not overfatigue           [facility] Adult PT Treatment/Exercise - 01/09/16 1627    Therapeutic Activites    Other Therapeutic Activities seating posture on floor and maintaing alignment of thorax over pelvis with gait    Neuro Re-ed    Neuro Re-ed Details  see pt instructions                      PT Long Term Goals - 01/09/16 1619    PT LONG TERM GOAL #1   Title Pt will decrease her PFDI  score from 55% to < 35% in order to demo improved pelvic floor function for ADLs.    Time 12   Period Weeks   Status On-going   PT LONG TERM GOAL #2   Title Pt will demo decreased tenderness/tensions of coccygeus mm bilaterally in order to return to sitting in the car for > 1hr with < 2/10 pain.    Time 12   Period Weeks   Status Achieved   PT LONG TERM GOAL #3   Title Pt will demo proper sitting posture with neutral pelvic tilt across 2 visits without cuing in order to sit on sofa without pain.   Time 12   Period Weeks   Status Achieved   PT LONG TERM GOAL #4   Title Pt will report decreased urinary leakage by 50% with coughing, sneezing, laughing in order to improve QOL.    Time 12   Period Weeks   Status On-going   PT LONG TERM GOAL #5   Title Pt will report ability to initiate urination without delay and ability to completely empty across 1 week in order to minimize pelvic floor strain.   Time 12   Period Weeks    Status On-going   PT LONG TERM GOAL #6   Title Pt will demo proper alignment and technique with fitness exercises in order to decrease relapse of Sx   Time 12   Period Weeks   Status Partially Met   PT LONG TERM GOAL #7   Title Pt will demo pelvic floor strength 4/7/7/7 in order to maintain pelvic girdle stability and decrease risk for pelvic floor dysfunction.    Time 12   Period Weeks   Status On-going               Plan - 01/09/16 1624    Clinical Impression Statement Pt continues to show more normal position of her coccyx. Progressed to external core strengthening but required minor cuing. Pt also required education to not over fatigue pelvic floor mm by separating her sets of endurance exercsies. Discussed body mechanics for pelvic girdle and use of proper to sit on floor when reading books and playing with her children.  Plan to discuss/demo sitting in car to prepare for upcoming long road trip .     Pt will benefit from skilled therapeutic intervention in order to improve on the following deficits Abnormal gait;Pain;Improper body mechanics;Hypermobility;Decreased activity tolerance;Decreased balance;Decreased endurance;Decreased range of motion;Decreased strength;Difficulty walking;Impaired flexibility;Postural dysfunction;Increased muscle spasms;Decreased coordination;Decreased safety awareness   Rehab Potential Good   PT Frequency 2x / week   PT Duration 12 weeks   PT Treatment/Interventions ADLs/Self Care Home Management;Aquatic Therapy;Biofeedback;Cryotherapy;Electrical Stimulation;Gait training;Traction;Moist Heat;Functional mobility Scientist, forensic;Therapeutic activities;Therapeutic exercise;Balance training;Neuromuscular re-education;Manual techniques;Patient/family education;Dry needling;Energy conservation;Taping   Consulted and Agree with Plan of Care Patient        Problem List There are no active problems to display for this patient.   Jerl Mina   ,PT, DPT, E-RYT  01/09/2016, 4:30 PM  [place] MAIN [facility] SERVICES [address] [place], [place], [address] Phone: [phone]   Fax:  [phone]  Name: Amanda Graham MRN: [id number] Date of Birth: 1976-09-04

## 2016-01-09 NOTE — Patient Instructions (Addendum)
Keep up with stretches    Strengthening:  Dorothy heel presses   Superwoman    "Jelly fish hold" Pelvic floor 4 reps 10 sec holds  X spread out across the day 4 sets   Deep core march/ heel slides (level 3-4)  30 reps x 2 set  Added:  Yellow band: on doorknob , feet on chair   bridging (elbow bent, knee bent ) 15 x 3  bridging ( arm straight up, shoulder down, heel s together, knee out) 15 x 3  Red band: on heel , knee bent, shoulders, elbow  down   pull band like accordian 15 x 3    Seating on floor with blocks or folded blanket to elevate hips, maintain neutral curve of spine and pelvic neutral   Walking with ribcage over pelvis (thumb on ribcage and ASIS for tactile cuing)

## 2016-01-10 ENCOUNTER — Ambulatory Visit: Payer: BLUE CROSS/BLUE SHIELD | Admitting: Physical Therapy

## 2016-01-10 DIAGNOSIS — M62838 Other muscle spasm: Secondary | ICD-10-CM

## 2016-01-10 DIAGNOSIS — M533 Sacrococcygeal disorders, not elsewhere classified: Secondary | ICD-10-CM | POA: Diagnosis not present

## 2016-01-10 DIAGNOSIS — M6289 Other specified disorders of muscle: Secondary | ICD-10-CM

## 2016-01-10 NOTE — Patient Instructions (Addendum)
Restorative poses:  10-15 min to rejuvenate   [email or web address]   _Legs up the wall (pillow under hips or yoga block )   _Opening chest (towel folded into thirds. Mini roll under neck curve) Pillows under thighs and calves   Pect stretch:   _ by the wall     Low /midback stretch:    _ yoga block vertical along midback and one under head, knees bent to avoid over arching of low back.      _supine twist:  Scoot hips to R, drop knees to L onto of pillows , head turn to R      Body scan technique:   recording in an email

## 2016-01-10 NOTE — Therapy (Signed)
[place] MAIN [facility] SERVICES [address] [place], [place], [address] Phone: [phone]   Fax:  [phone]  Physical Therapy Treatment/ Progress Note  Patient Details  Name: EQUILLA QUE MRN: [id number] Date of Birth: 1975-11-23 Referring Provider: Andres Labrum MD   Encounter Date: 01/10/2016      PT End of Session - 01/10/16 2303    Visit Number 5   Number of Visits 12   Date for PT Re-Evaluation 03/14/16   PT Start Time 6553   PT Stop Time 1630   PT Time Calculation (min) 60 min   Activity Tolerance Patient tolerated treatment well;No increased pain   Behavior During Therapy [facility] for tasks assessed/performed      Past Medical History  Diagnosis Date  . Diffuse cystic mastopathy   . Lump or mass in breast 2012    Right breast    Past Surgical History  Procedure Laterality Date  . [place] gland  2003    I&D  . Lasik  2003  . Breast benign lumps removed  Left 2016   . Breast surgery Bilateral 05/2015    breast augmentation with implants. Dr Lanny Cramp    There were no vitals filed for this visit.  Visit Diagnosis:  Coccyx pain  Pelvic floor dysfunction  Muscle spasm      Subjective Assessment - 01/10/16 1547    Subjective Pt reported trying HEP from last session and inquired  if she would needed to be using the red band w/ bridging exercises. Pt will be traveling next week on a long road trip and would like to know how to sit properly. Pt reported trying to sit on her couch in reclined position but feel tenderness at her tailbone.    Pertinent History four vaginal deliveries, perineal tears and pelvic girdle pain with 3rd child. Hx of Bartholin Gland removal.  Hx of B knee pain with running. Pt would like to return to trail running.    Patient Stated Goals be normal and return to exercise with confidence, no lakage no tailbone pain             [facility] PT Assessment - 01/10/16 2300    Observation/Other Assessments   Observations able to correct posture (thorax over pelvis) minor cuing   Ambulation/Gait   Gait Comments proper thorax over pelvis alignment post Tx                  Pelvic Floor Special Questions - 01/09/16 1548    Pelvic Floor Internal Exam pt consented verbally without contraindications   Exam Type Vaginal   Palpation no mm tensions noted, able to elicit normal ROM and Grade 4 contraction    Strength good squeeze, good lift, able to hold agaisnt strong resistance   Strength # of reps 5   Strength # of seconds 3   Biofeedback fatigued quickly due to pt reporting having performed her 2 sets of 10sec holds back to back. Educated pt on not performing  endurance exercises back and back  to not overfatigue           [facility] Adult PT Treatment/Exercise - 01/10/16 2300    Therapeutic Activites    Other Therapeutic Activities car seat modifications with towels    Neuro Re-ed    Neuro Re-ed Details  see pt instructions                PT Education - 01/10/16 2303    Education provided  Yes   Education Details HEP   Person(s) Educated Patient   Methods Explanation;Demonstration;Tactile cues;Verbal cues;Handout   Comprehension Returned demonstration;Verbalized understanding             PT Long Term Goals - 01/10/16 1631    PT LONG TERM GOAL #1   Title Pt will decrease her PFDI score from 55% to < 35% in order to demo improved pelvic floor function for ADLs. (01/10/16: 35%)    Time 12   Period Weeks   Status Achieved   PT LONG TERM GOAL #2   Title Pt will demo decreased tenderness/tensions of coccygeus mm bilaterally in order to return to sitting in the car for > 1hr with < 2/10 pain.    Time 12   Period Weeks   Status Achieved   PT LONG TERM GOAL #3   Title Pt will demo proper sitting posture with neutral pelvic tilt across 2 visits without cuing in order to sit on sofa without pain.   Time 12   Period Weeks   Status Achieved   PT LONG TERM GOAL #4   Title  Pt will report decreased urinary leakage by 50% with coughing, sneezing, laughing in order to improve QOL.    Time 12   Period Weeks   Status Achieved   PT LONG TERM GOAL #5   Title Pt will report ability to initiate urination without delay and ability to completely empty across 1 week in order to minimize pelvic floor strain.   Time 12   Period Weeks   Status Achieved   PT LONG TERM GOAL #6   Title Pt will demo proper alignment and technique with fitness exercises in order to decrease relapse of Sx   Time 12   Period Weeks   Status Partially Met   PT LONG TERM GOAL #7   Title Pt will demo pelvic floor strength 4/7/7/7 in order to maintain pelvic girdle stability and decrease risk for pelvic floor dysfunction.    Time 12   Period Weeks   Status Partially Met               Plan - 01/10/16 2304    Clinical Impression Statement PT has achieved 5/7 goals with ability to sit through a movie without pain, completely urinate with ease, and not experience u rinary leakage with coughing,sneezing, laughing. Pt is progressing well to towards her remaining goals related to fitness exercises and strengthening. Pt will continue to benefit from skilled PT in order to minimize Sx and maintain benefits of PT treatments long -term .     Pt will benefit from skilled therapeutic intervention in order to improve on the following deficits Abnormal gait;Pain;Improper body mechanics;Hypermobility;Decreased activity tolerance;Decreased balance;Decreased endurance;Decreased range of motion;Decreased strength;Difficulty walking;Impaired flexibility;Postural dysfunction;Increased muscle spasms;Decreased coordination;Decreased safety awareness   Rehab Potential Good   PT Frequency 2x / week   PT Duration 12 weeks   PT Treatment/Interventions ADLs/Self Care Home Management;Aquatic Therapy;Biofeedback;Cryotherapy;Electrical Stimulation;Gait training;Traction;Moist Heat;Functional mobility Art gallery manager;Therapeutic activities;Therapeutic exercise;Balance training;Neuromuscular re-education;Manual techniques;Patient/family education;Dry needling;Energy conservation;Taping   Consulted and Agree with Plan of Care Patient        Problem List There are no active problems to display for this patient.   Jerl Mina ,PT, DPT, E-RYT   01/10/2016, 11:21 PM  [place] MAIN [facility] SERVICES [address] [place], [place], [address] Phone: [phone]   Fax:  [phone]  Name: NEIDY GUERRIERI MRN: [id number] Date of Birth: [birth year]

## 2016-01-21 ENCOUNTER — Ambulatory Visit: Payer: BLUE CROSS/BLUE SHIELD | Admitting: Physical Therapy

## 2016-01-23 ENCOUNTER — Encounter: Payer: BLUE CROSS/BLUE SHIELD | Admitting: Physical Therapy

## 2016-01-29 ENCOUNTER — Ambulatory Visit: Payer: BLUE CROSS/BLUE SHIELD | Attending: Certified Nurse Midwife | Admitting: Physical Therapy

## 2016-01-29 DIAGNOSIS — M791 Myalgia: Secondary | ICD-10-CM | POA: Diagnosis present

## 2016-01-29 DIAGNOSIS — M6289 Other specified disorders of muscle: Secondary | ICD-10-CM

## 2016-01-29 DIAGNOSIS — M62838 Other muscle spasm: Secondary | ICD-10-CM | POA: Diagnosis present

## 2016-01-29 DIAGNOSIS — M533 Sacrococcygeal disorders, not elsewhere classified: Secondary | ICD-10-CM | POA: Insufficient documentation

## 2016-01-29 DIAGNOSIS — R279 Unspecified lack of coordination: Secondary | ICD-10-CM | POA: Diagnosis present

## 2016-01-29 DIAGNOSIS — N8184 Pelvic muscle wasting: Secondary | ICD-10-CM | POA: Insufficient documentation

## 2016-01-29 NOTE — Patient Instructions (Addendum)
Lat pulldown with blue band   10 x 2 sets each leg forward    _____________  Elliptical:  Start with 15 min then stretch , then 15 min  Cues: string pulling crown of head and breathe     Figure 4 stretch and cross over twist

## 2016-01-30 NOTE — Therapy (Signed)
[place] MAIN [facility] SERVICES [address] [place], [place], [address] Phone: [phone]   Fax:  [phone]  Physical Therapy Treatment  Patient Details  Name: Amanda Graham MRN: [id number] Date of Birth: December 26, 1975 Referring Provider: Andres Labrum MD   Encounter Date: 01/29/2016      PT End of Session - 01/30/16 1856    Visit Number 6   Number of Visits 12   Date for PT Re-Evaluation 03/14/16   PT Start Time 3149   PT Stop Time 1600   PT Time Calculation (min) 65 min   Activity Tolerance Patient tolerated treatment well;No increased pain   Behavior During Therapy [facility] for tasks assessed/performed      Past Medical History  Diagnosis Date  . Diffuse cystic mastopathy   . Lump or mass in breast 2012    Right breast    Past Surgical History  Procedure Laterality Date  . [place] gland  2003    I&D  . Lasik  2003  . Breast benign lumps removed  Left 2016   . Breast surgery Bilateral 05/2015    breast augmentation with implants. Dr Lanny Cramp    There were no vitals filed for this visit.  Visit Diagnosis:  Coccyx pain  Pelvic floor dysfunction  Muscle spasm      Subjective Assessment - 01/29/16 1459    Subjective Pt reported she returned from a road trip and had soreness in her coccyx but has been able to self-manage. Pt also had a lyposuction procedure  in between the last session and today.     Pertinent History four vaginal deliveries, perineal tears and pelvic girdle pain with 3rd child. Hx of Bartholin Gland removal.  Hx of B knee pain with running. Pt would like to return to trail running.    Patient Stated Goals be normal and return to exercise with confidence, no lakage no tailbone pain             [facility] PT Assessment - 01/30/16 0811    Palpation   Palpation comment coccyx slightly flexed, tenderness and tensions bilaterally coccygeus                  Pelvic Floor Special Questions - 01/30/16 0814     Pelvic Floor Internal Exam pt consented verbally without contraindications   Exam Type Vaginal   Palpation no mm tensions noted, able to elicit normal ROM and Grade 4 contraction    Strength good squeeze, good lift, able to hold agaisnt strong resistance   Strength # of reps 4   Strength # of seconds 7           [facility] Adult PT Treatment/Exercise - 01/30/16 7026    Neuro Re-ed    Neuro Re-ed Details  lat pull downs 10 reps in semi tandem stance  cued for decreased umbar lordosis and upper trap overuse   Manual Therapy   Manual therapy comments sustained pressure externally on R coccygeus, gentle mobilization internally and externally in supine for coccyx mobility.                      PT Long Term Goals - 01/29/16 1502    PT LONG TERM GOAL #1   Title Pt will decrease her PFDI score from 55% to < 35% in order to demo improved pelvic floor function for ADLs. (01/10/16: 35%)    Time 12   Period Weeks   Status Achieved  PT LONG TERM GOAL #2   Title Pt will demo decreased tenderness/tensions of coccygeus mm bilaterally in order to return to sitting in the car for > 1hr with < 2/10 pain.    Time 12   Period Weeks   Status Achieved   PT LONG TERM GOAL #3   Title Pt will demo proper sitting posture with neutral pelvic tilt across 2 visits without cuing in order to sit on sofa without pain.   Time 12   Period Weeks   Status Achieved   PT LONG TERM GOAL #4   Title Pt will report decreased urinary leakage by 50% with coughing, sneezing, laughing in order to improve QOL.    Time 12   Period Weeks   Status Achieved   PT LONG TERM GOAL #5   Title Pt will report ability to initiate urination without delay and ability to completely empty across 1 week in order to minimize pelvic floor strain.   Time 12   Period Weeks   Status Achieved   PT LONG TERM GOAL #6   Title Pt will demo proper alignment and technique with fitness exercises in order to decrease relapse of Sx    Time 12   Period Weeks   Status Partially Met   PT LONG TERM GOAL #7   Title Pt will demo pelvic floor strength 4/7/5/5 in order to maintain pelvic girdle stability and decrease risk for pelvic floor dysfunction.    Time 12   Period Weeks   Status Revised               Plan - 01/30/16 6759    Clinical Impression Statement Pt was able to self-manage across the past 2-3 weeks with increased sitting on a road trip. Pt's coccyx position was re-aligned to a less flexed position post-Tx. Pt demo'd correct technique for self-mobilization. Pt progressed to theraband exercises and pelvic floor strengthening without difficulty.   Pt will benefit from skilled therapeutic intervention in order to improve on the following deficits Abnormal gait;Pain;Improper body mechanics;Hypermobility;Decreased activity tolerance;Decreased balance;Decreased endurance;Decreased range of motion;Decreased strength;Difficulty walking;Impaired flexibility;Postural dysfunction;Increased muscle spasms;Decreased coordination;Decreased safety awareness   Rehab Potential Good   PT Frequency 2x / week   PT Duration 12 weeks   PT Treatment/Interventions ADLs/Self Care Home Management;Aquatic Therapy;Biofeedback;Cryotherapy;Electrical Stimulation;Gait training;Traction;Moist Heat;Functional mobility Scientist, forensic;Therapeutic activities;Therapeutic exercise;Balance training;Neuromuscular re-education;Manual techniques;Patient/family education;Dry needling;Energy conservation;Taping   Consulted and Agree with Plan of Care Patient        Problem List There are no active problems to display for this patient.   Jerl Mina ,PT, DPT, E-RYT  01/30/2016, 8:16 AM  [place] MAIN [facility] SERVICES [address] [place], [place], [address] Phone: [phone]   Fax:  [phone]  Name: Amanda Graham MRN: [id number] Date of Birth: 07-06-1976

## 2016-02-08 ENCOUNTER — Ambulatory Visit: Payer: BLUE CROSS/BLUE SHIELD | Admitting: Physical Therapy

## 2016-02-08 DIAGNOSIS — M533 Sacrococcygeal disorders, not elsewhere classified: Secondary | ICD-10-CM | POA: Diagnosis not present

## 2016-02-08 DIAGNOSIS — M791 Myalgia, unspecified site: Secondary | ICD-10-CM

## 2016-02-08 DIAGNOSIS — R279 Unspecified lack of coordination: Secondary | ICD-10-CM

## 2016-02-08 NOTE — Patient Instructions (Addendum)
Abdominal massage (handout)   Creating time for relaxation and release tensions and holding pattern in abdomen to decrease lowered position of bladder

## 2016-02-08 NOTE — Therapy (Signed)
[place] MAIN [facility] SERVICES [address] [place], [place], [address] Phone: [phone]   Fax:  [phone]  Physical Therapy Treatment  Patient Details  Name: Amanda Graham MRN: [id number] Date of Birth: [birth year] Referring Provider: Andres Labrum MD   Encounter Date: 02/08/2016      PT End of Session - 02/08/16 2230    Visit Number 7   Number of Visits 12   Date for PT Re-Evaluation 03/14/16   PT Start Time 1300   PT Stop Time 1400   PT Time Calculation (min) 60 min   Activity Tolerance Patient tolerated treatment well;No increased pain   Behavior During Therapy [facility] for tasks assessed/performed      Past Medical History  Diagnosis Date  . Diffuse cystic mastopathy   . Lump or mass in breast 2012    Right breast    Past Surgical History  Procedure Laterality Date  . [place] gland  2003    I&D  . Lasik  2003  . Breast benign lumps removed  Left 2016   . Breast surgery Bilateral 05/2015    breast augmentation with implants. Dr Lanny Cramp    There were no vitals filed for this visit.      Subjective Assessment - 02/08/16 1310    Subjective Pt reported she took another long road trip and stopped at rest stop every 1.5-2 hr and performed stretches and self-mobilization techniques to minimize relapse of flexed coccyx. pt had low pain 1/10  upon returning from the trip. Pt noticed when she drove for long periods > 30 min, pt starts feeling the pain more. Pt had less issues in passenger side. Pt 's other issue is related to incomplete emptying w/ urination.     Pertinent History four vaginal deliveries, perineal tears and pelvic girdle pain with 3rd child. Hx of Bartholin Gland removal.  Hx of B knee pain with running. Pt would like to return to trail running.    Patient Stated Goals be normal and return to exercise with confidence, no lakage no tailbone pain             [facility] PT Assessment - 02/08/16 2227    Palpation   Palpation comment increased lower abdominal mm and holding tightness 2/2 anxiety and stress                  Pelvic Floor Special Questions - 02/08/16 2221    Pelvic Floor Internal Exam pt consented verbally without contraindications   Exam Type Vaginal   Palpation no mm tensions but bladder in more ventral position    Biofeedback posterior contraction of pelvic floor mm . anterior contraction limited by lower abdominal tensions           [facility] Adult PT Treatment/Exercise - 02/08/16 2228    Neuro Re-ed    Neuro Re-ed Details  self-care practices to relax and not tightness abdominal / pelvic girdle region   Manual Therapy   Manual therapy comments abdominal massage to decrease tensions                 PT Education - 02/08/16 2229    Education provided Yes   Education Details HEP   Person(s) Educated Patient   Methods Demonstration;Tactile cues;Verbal cues;Handout;Explanation   Comprehension Verbalized understanding;Returned demonstration             PT Long Term Goals - 01/29/16 1502    PT LONG TERM GOAL #1   Title  Pt will decrease her PFDI score from 55% to < 35% in order to demo improved pelvic floor function for ADLs. (01/10/16: 35%)    Time 12   Period Weeks   Status Achieved   PT LONG TERM GOAL #2   Title Pt will demo decreased tenderness/tensions of coccygeus mm bilaterally in order to return to sitting in the car for > 1hr with < 2/10 pain.    Time 12   Period Weeks   Status Achieved   PT LONG TERM GOAL #3   Title Pt will demo proper sitting posture with neutral pelvic tilt across 2 visits without cuing in order to sit on sofa without pain.   Time 12   Period Weeks   Status Achieved   PT LONG TERM GOAL #4   Title Pt will report decreased urinary leakage by 50% with coughing, sneezing, laughing in order to improve QOL.    Time 12   Period Weeks   Status Achieved   PT LONG TERM GOAL #5   Title Pt will report ability to initiate urination  without delay and ability to completely empty across 1 week in order to minimize pelvic floor strain.   Time 12   Period Weeks   Status Achieved   PT LONG TERM GOAL #6   Title Pt will demo proper alignment and technique with fitness exercises in order to decrease relapse of Sx   Time 12   Period Weeks   Status Partially Met   PT LONG TERM GOAL #7   Title Pt will demo normal position of bladder, complete closure of pelvic floor mm,  and no abdominal tensions in order to optimize pelvic floor function and return to fitness.    Time 12   Period Weeks   Status Revised               Plan - 02/08/16 2230    Clinical Impression Statement  Pt is progressing well towards her goals with achievement of 5/7 goals.  Pt was able to self-manage on her coccyx pain during her second long car ride.  Pt showed improved upright posture. Pt showed lower position of bladder  which limited complete closure of pelvic floor mm and was attributed to  her holding patterns around her abdomen/pelvic area 2/2 to stress.  Pt was  educated on self-care practices.  Pt will continue to benefit from skilled PT to achieve her remaining goals.     Rehab Potential Good   PT Frequency 2x / week   PT Duration 12 weeks   PT Treatment/Interventions ADLs/Self Care Home Management;Aquatic Therapy;Biofeedback;Cryotherapy;Electrical Stimulation;Gait training;Traction;Moist Heat;Functional mobility Scientist, forensic;Therapeutic activities;Therapeutic exercise;Balance training;Neuromuscular re-education;Manual techniques;Patient/family education;Dry needling;Energy conservation;Taping   Consulted and Agree with Plan of Care Patient      Patient will benefit from skilled therapeutic intervention in order to improve the following deficits and impairments:  Abnormal gait, Pain, Improper body mechanics, Hypermobility, Decreased activity tolerance, Decreased balance, Decreased endurance, Decreased range of motion,  Decreased strength, Difficulty walking, Impaired flexibility, Postural dysfunction, Increased muscle spasms, Decreased coordination, Decreased safety awareness  Visit Diagnosis: Sacrococcygeal disorders, not elsewhere classified  Unspecified lack of coordination  Myalgia     Problem List There are no active problems to display for this patient.   Jerl Mina ,PT, DPT, E-RYT  02/08/2016, 10:37 PM  [place] MAIN [facility] SERVICES [address] [place], [place], [address] Phone: [phone]   Fax:  [phone]  Name: Amanda Graham  MRN: [id number] Date of Birth: 1975-10-31

## 2016-02-12 ENCOUNTER — Ambulatory Visit (INDEPENDENT_AMBULATORY_CARE_PROVIDER_SITE_OTHER): Payer: BLUE CROSS/BLUE SHIELD | Admitting: General Surgery

## 2016-02-12 ENCOUNTER — Encounter: Payer: Self-pay | Admitting: General Surgery

## 2016-02-12 VITALS — BP 118/78 | HR 72 | Resp 12 | Ht 63.0 in | Wt 120.0 lb

## 2016-02-12 DIAGNOSIS — N6019 Diffuse cystic mastopathy of unspecified breast: Secondary | ICD-10-CM | POA: Diagnosis not present

## 2016-02-12 NOTE — Patient Instructions (Signed)
The patient is aware to call back for any questions or concerns.  

## 2016-02-12 NOTE — Progress Notes (Addendum)
Patient ID: Amanda Graham, female   DOB: 1976/03/04, 40 y.o.   MRN: [id number]  Chief Complaint  Patient presents with  . Follow-up    HPI Amanda Graham is a 40 y.o. female.  Here today for her one year follow up breast mass. She denies any new breast issues. She has undergone bilateral breast augmentation with implants in May 2016 and most recent bilateral breast fat transfer March 2017.  She plans on moving to [place] early summer. I have reviewed the history of present illness with the patient.   HPI  Past Medical History  Diagnosis Date  . Diffuse cystic mastopathy   . Lump or mass in breast 2012    Right breast    Past Surgical History  Procedure Laterality Date  . [place] gland  2003    I&D  . Lasik  2003  . Breast benign lumps removed  Left 2016   . Breast surgery Bilateral 02/2015    breast augmentation with implants. Dr Lanny Cramp  . Breast surgery Bilateral March 2017    fat transfer    Family History  Problem Relation Age of Onset  . Breast cancer Paternal Aunt   . Colon cancer      Maternal Great Aunt    Social History Social History  Substance Use Topics  . Smoking status: Never Smoker   . Smokeless tobacco: Never Used  . Alcohol Use: 0.0 oz/week    0 Standard drinks or equivalent per week    No Known Allergies  Current Outpatient Prescriptions  Medication Sig Dispense Refill  . ibuprofen (ADVIL,MOTRIN) 200 MG tablet Take 200 mg by mouth every 6 (six) hours as needed.     No current facility-administered medications for this visit.    Review of Systems Review of Systems  Constitutional: Negative.   Respiratory: Negative.   Cardiovascular: Negative.     Blood pressure 118/78, pulse 72, resp. rate 12, height 5\' 3"  (1.6 m), weight 120 lb (54.432 kg).  Physical Exam Physical Exam  Constitutional: She is oriented to person, place, and time. She appears well-developed and well-nourished.  HENT:  Mouth/Throat: Oropharynx is clear and moist.   Eyes: Conjunctivae are normal. No scleral icterus.  Neck: Neck supple.  Cardiovascular: Normal rate, regular rhythm and normal heart sounds.   Pulmonary/Chest: Effort normal and breath sounds normal. Right breast exhibits no inverted nipple, no mass, no nipple discharge, no skin change and no tenderness. Left breast exhibits no inverted nipple, no mass, no nipple discharge, no skin change and no tenderness.  Thickening right breast axillary tail has diminished.   Lymphadenopathy:    She has no cervical adenopathy.    She has no axillary adenopathy.  Neurological: She is alert and oriented to person, place, and time.  Skin: Skin is warm and dry.  Psychiatric: Her behavior is normal.    Data Reviewed Prior notes.  Assessment    Right breast thickening at axillary tail has diminished. FCD by prior biopsy    Plan    Continue self breast exams. She is aware to become established at her new location for any breast issues-pt states she will return here for her routine yrly f/u     PCP:  Amanda Graham  This information has been scribed by Karie Fetch RN, BSN,BC.   Eiman Maret G 02/15/2016, 8:50 AM

## 2016-02-29 ENCOUNTER — Ambulatory Visit: Payer: BLUE CROSS/BLUE SHIELD | Attending: Certified Nurse Midwife | Admitting: Physical Therapy

## 2016-02-29 DIAGNOSIS — M533 Sacrococcygeal disorders, not elsewhere classified: Secondary | ICD-10-CM | POA: Diagnosis present

## 2016-02-29 DIAGNOSIS — M791 Myalgia, unspecified site: Secondary | ICD-10-CM

## 2016-02-29 DIAGNOSIS — R279 Unspecified lack of coordination: Secondary | ICD-10-CM | POA: Diagnosis present

## 2016-02-29 NOTE — Therapy (Signed)
[place] MAIN [facility] SERVICES [address] [place], [place], [address] Phone: [phone]   Fax:  [phone]  Physical Therapy Treatment  Patient Details  Name: Amanda Graham MRN: [id number] Date of Birth: [birth year] Referring Provider: Andres Labrum MD   Encounter Date: 02/29/2016      PT End of Session - 02/29/16 2320    Visit Number 8   Number of Visits 12   Date for PT Re-Evaluation 03/14/16   PT Start Time 1100   PT Stop Time 1140   PT Time Calculation (min) 40 min   Activity Tolerance Patient tolerated treatment well;No increased pain   Behavior During Therapy [facility] for tasks assessed/performed      Past Medical History  Diagnosis Date  . Diffuse cystic mastopathy   . Lump or mass in breast 2012    Right breast    Past Surgical History  Procedure Laterality Date  . [place] gland  2003    I&D  . Lasik  2003  . Breast benign lumps removed  Left 2016   . Breast surgery Bilateral 02/2015    breast augmentation with implants. Dr Lanny Cramp  . Breast surgery Bilateral March 2017    fat transfer    There were no vitals filed for this visit.      Subjective Assessment - 02/29/16 1105    Subjective Since last session 3 weeks ago, pt ran (30-40 min for 4 miles) 3 x, and the elliptical 3 x with stretching and HEP.  For the past week, her tailbone felt more sore with sitting.    Pertinent History four vaginal deliveries, perineal tears and pelvic girdle pain with 3rd child. Hx of Bartholin Gland removal.  Hx of B knee pain with running. Pt would like to return to trail running.    Patient Stated Goals be normal and return to exercise with confidence, no lakage no tailbone pain             [facility] PT Assessment - 02/29/16 2314    Observation/Other Assessments   Observations self-correct to upright posture in sitting, standing   Other:   Other/ Comments self-selected weight training included poor body mechanics and alignment    Palpation   Palpation comment coccyx in normal position                      [facility] Adult PT Treatment/Exercise - 02/29/16 2315    Therapeutic Activites    Other Therapeutic Activities modified self-selected weight training exercises    Neuro Re-ed    Neuro Re-ed Details  proper alignment when using dumbbells and progression of therabands                 PT Education - 02/29/16 2320    Education provided Yes   Education Details HEP   Person(s) Educated Patient   Methods Explanation;Demonstration;Tactile cues;Verbal cues;Handout   Comprehension Returned demonstration;Verbalized understanding             PT Long Term Goals - 02/08/16 2238    PT LONG TERM GOAL #1   Title Pt will decrease her PFDI score from 55% to < 35% in order to demo improved pelvic floor function for ADLs. (01/10/16: 35%)    Time 12   Period Weeks   Status Achieved   PT LONG TERM GOAL #2   Title Pt will demo decreased tenderness/tensions of coccygeus mm bilaterally in order to return to sitting in the car  for > 1hr with < 2/10 pain.    Time 12   Period Weeks   Status Achieved   PT LONG TERM GOAL #3   Title Pt will demo proper sitting posture with neutral pelvic tilt across 2 visits without cuing in order to sit on sofa without pain.   Time 12   Period Weeks   Status Achieved   PT LONG TERM GOAL #4   Title Pt will report decreased urinary leakage by 50% with coughing, sneezing, laughing in order to improve QOL.    Time 12   Period Weeks   Status Achieved   PT LONG TERM GOAL #5   Title Pt will report ability to initiate urination without delay and ability to completely empty across 1 week in order to minimize pelvic floor strain.   Time 12   Period Weeks   Status Achieved   PT LONG TERM GOAL #6   Title Pt will demo proper alignment and technique with fitness exercises in order to decrease relapse of Sx   Time 12   Period Weeks   Status Partially Met   PT LONG TERM GOAL #7    Title Pt will demo normal position of bladder, complete closure of pelvic floor mm,  and no abdominal tensions in order to optimize pelvic floor function and return to fitness.    Time 12   Period Weeks   Status Revised               Plan - 02/29/16 2320    Clinical Impression Statement Pt is progressing well with correct technique and form to modified weight training exercises. Pt is close to achieving her goals and will benefit from 1-2 more visits before d/c.   Rehab Potential Good   PT Frequency 2x / week   PT Duration 12 weeks   PT Treatment/Interventions ADLs/Self Care Home Management;Aquatic Therapy;Biofeedback;Cryotherapy;Electrical Stimulation;Gait training;Traction;Moist Heat;Functional mobility Scientist, forensic;Therapeutic activities;Therapeutic exercise;Balance training;Neuromuscular re-education;Manual techniques;Patient/family education;Dry needling;Energy conservation;Taping   Consulted and Agree with Plan of Care Patient      Patient will benefit from skilled therapeutic intervention in order to improve the following deficits and impairments:  Abnormal gait, Pain, Improper body mechanics, Hypermobility, Decreased activity tolerance, Decreased balance, Decreased endurance, Decreased range of motion, Decreased strength, Difficulty walking, Impaired flexibility, Postural dysfunction, Increased muscle spasms, Decreased coordination, Decreased safety awareness  Visit Diagnosis: Sacrococcygeal disorders, not elsewhere classified  Unspecified lack of coordination  Myalgia     Problem List There are no active problems to display for this patient.   Jerl Mina 02/29/2016, [place] MAIN [facility] SERVICES [address] [place], [place], [address] Phone: [phone]   Fax:  [phone]  Name: Amanda Graham MRN: [id number] Date of Birth: 1975-12-11

## 2016-02-29 NOTE — Patient Instructions (Signed)
Internal/ External rotation with red band, towel under elbow 10 x 2 sets each   PNF cross diagonals: blue band "pulling lawn mower" 10 x 2 sets " fastening seatbelt  10 x 2 sets  Avoid lifting heavy dumbbell in shoulder abduction to shoulder height.  Keep 7 # weights to 30 deg from midline  Avoid scarecrow movements with dumbbells   Tricep/ single chest row in quadriped position  (keep hip over knee)  10 x 2 sets each   Stretch: eagle pose

## 2016-03-13 ENCOUNTER — Ambulatory Visit: Payer: BLUE CROSS/BLUE SHIELD | Admitting: Physical Therapy

## 2016-03-13 DIAGNOSIS — M533 Sacrococcygeal disorders, not elsewhere classified: Secondary | ICD-10-CM

## 2016-03-13 DIAGNOSIS — M791 Myalgia, unspecified site: Secondary | ICD-10-CM

## 2016-03-13 DIAGNOSIS — R279 Unspecified lack of coordination: Secondary | ICD-10-CM

## 2016-03-14 NOTE — Therapy (Addendum)
[place] MAIN [facility] SERVICES [address] [place], [place], [address] Phone: [phone]   Fax:  [phone]  Physical Therapy Treatment / Discharge Summary   Patient Details  Name: Amanda Graham MRN: [id number] Date of Birth: 1976-09-23 Referring Provider: Andres Labrum MD   Encounter Date: 03/13/2016      PT End of Session - 03/14/16 2229    Visit Number 9   Number of Visits 12   Date for PT Re-Evaluation 03/14/16   PT Start Time 1430   PT Stop Time 1520   PT Time Calculation (min) 50 min   Activity Tolerance Patient tolerated treatment well;No increased pain   Behavior During Therapy [facility] for tasks assessed/performed      Past Medical History  Diagnosis Date  . Diffuse cystic mastopathy   . Lump or mass in breast 2012    Right breast    Past Surgical History  Procedure Laterality Date  . [place] gland  2003    I&D  . Lasik  2003  . Breast benign lumps removed  Left 2016   . Breast surgery Bilateral 02/2015    breast augmentation with implants. Dr Lanny Cramp  . Breast surgery Bilateral March 2017    fat transfer    There were no vitals filed for this visit.      Subjective Assessment - 03/14/16 2254    Subjective Pt reports her coccyx pain had worsened but pt has been doing all of her exercises most days. Pt reported she is spotting as if she is on her menstrual cycle.                          [place] Adult PT Treatment/Exercise - 03/14/16 2252    Neuro Re-ed    Neuro Re-ed Details  see pt instructions                      PT Long Term Goals - 03/13/16 1518    PT LONG TERM GOAL #1   Title Pt will decrease her PFDI score from 55% to < 35% in order to demo improved pelvic floor function for ADLs. (01/10/16: 35%)    Time 12   Period Weeks   Status Achieved   PT LONG TERM GOAL #2   Title Pt will demo decreased tenderness/tensions of coccygeus mm bilaterally in order to return to sitting in  the car for > 1hr with < 2/10 pain.    Time 12   Period Weeks   Status Achieved   PT LONG TERM GOAL #3   Title Pt will demo proper sitting posture with neutral pelvic tilt across 2 visits without cuing in order to sit on sofa without pain.   Time 12   Period Weeks   Status Achieved   PT LONG TERM GOAL #4   Title Pt will report decreased urinary leakage by 50% with coughing, sneezing, laughing in order to improve QOL.    Time 12   Period Weeks   Status Achieved   PT LONG TERM GOAL #5   Title Pt will report ability to initiate urination without delay and ability to completely empty across 1 week in order to minimize pelvic floor strain.   Time 12   Period Weeks   Status Achieved   PT LONG TERM GOAL #6   Title Pt will demo proper alignment and technique with fitness exercises in order to decrease relapse  of Sx   Time 12   Period Weeks   Status Achieved   PT LONG TERM GOAL #7   Title Pt will demo normal position of bladder, complete closure of pelvic floor mm,  and no abdominal tensions in order to optimize pelvic floor function and return to fitness.    Time 12   Period Weeks   Status Achieved               Plan - 03/14/16 2230    Clinical Impression Statement Pt has achieved 100% of her goals and has been able to self manage with compliance to HEP and self-mobilization techniques. Pt showed normal position of coccyx today and signifcantly improved upright posture and back mm strength. Pt  was provided educated on pain science retraining to prevent catastrophization of pain and the possible effect of hormones during menstrual cycles on ligamentous laxity around the coccyx. Pt progressed to more advanced deep core and outer core exercises/ body weight training in functional positions which pt demo'd without difficulty. Pt is getting d/c at this time. Thank you for your referral.       Rehab Potential Good   PT Frequency 2x / week   PT Duration 12 weeks   PT  Treatment/Interventions ADLs/Self Care Home Management;Aquatic Therapy;Biofeedback;Cryotherapy;Electrical Stimulation;Gait training;Traction;Moist Heat;Functional mobility Scientist, forensic;Therapeutic activities;Therapeutic exercise;Balance training;Neuromuscular re-education;Manual techniques;Patient/family education;Dry needling;Energy conservation;Taping   Consulted and Agree with Plan of Care Patient      Patient will benefit from skilled therapeutic intervention in order to improve the following deficits and impairments:  Abnormal gait, Pain, Improper body mechanics, Hypermobility, Decreased activity tolerance, Decreased balance, Decreased endurance, Decreased range of motion, Decreased strength, Difficulty walking, Impaired flexibility, Postural dysfunction, Increased muscle spasms, Decreased coordination, Decreased safety awareness  Visit Diagnosis: Sacrococcygeal disorders, not elsewhere classified  Unspecified lack of coordination  Myalgia     Problem List There are no active problems to display for this patient.   Jerl Mina ,PT, DPT, E-RYT  03/14/2016, 10:54 PM  [place] MAIN [facility] SERVICES [address] [place], [place], [address] Phone: [phone]   Fax:  [phone]  Name: Amanda Graham MRN: [id number] Date of Birth: 27-May-1976

## 2016-03-21 ENCOUNTER — Encounter: Payer: BLUE CROSS/BLUE SHIELD | Admitting: Physical Therapy

## 2017-02-11 ENCOUNTER — Ambulatory Visit: Payer: Self-pay | Admitting: General Surgery

## 2020-01-02 ENCOUNTER — Encounter: Payer: Self-pay | Admitting: Obstetrics and Gynecology

## 2020-01-02 ENCOUNTER — Ambulatory Visit (INDEPENDENT_AMBULATORY_CARE_PROVIDER_SITE_OTHER): Payer: BC Managed Care – PPO | Admitting: Obstetrics and Gynecology

## 2020-01-02 ENCOUNTER — Other Ambulatory Visit (HOSPITAL_COMMUNITY)
Admission: RE | Admit: 2020-01-02 | Discharge: 2020-01-02 | Disposition: A | Discharge status: Home / Self Care | Payer: BC Managed Care – PPO | Source: Ambulatory Visit | Attending: Obstetrics and Gynecology | Admitting: Obstetrics and Gynecology

## 2020-01-02 ENCOUNTER — Other Ambulatory Visit: Payer: Self-pay

## 2020-01-02 VITALS — BP 108/70 | Ht 64.0 in | Wt 123.0 lb

## 2020-01-02 DIAGNOSIS — Z30431 Encounter for routine checking of intrauterine contraceptive device: Secondary | ICD-10-CM | POA: Diagnosis not present

## 2020-01-02 DIAGNOSIS — Z1231 Encounter for screening mammogram for malignant neoplasm of breast: Secondary | ICD-10-CM

## 2020-01-02 DIAGNOSIS — Z1151 Encounter for screening for human papillomavirus (HPV): Secondary | ICD-10-CM | POA: Insufficient documentation

## 2020-01-02 DIAGNOSIS — Z124 Encounter for screening for malignant neoplasm of cervix: Secondary | ICD-10-CM

## 2020-01-02 DIAGNOSIS — Z01419 Encounter for gynecological examination (general) (routine) without abnormal findings: Secondary | ICD-10-CM | POA: Diagnosis not present

## 2020-01-02 NOTE — Progress Notes (Addendum)
PCP:  [place] Pink, MD   Chief Complaint  Patient presents with  . Gynecologic Exam     HPI:      Ms. Amanda Graham is a 44 y.o. G4P4 who LMP was No LMP recorded. (Menstrual status: IUD)., presents today for her NP> 3 yrs annual examination.  Her menses are absent with IUD.  Dysmenorrhea none. She does have infrequent intermenstrual bleeding.  Sex activity: single partner, contraception - IUD. Mirena placed 12/20/2018 in [place]. Last Pap: 11/04/17 Results were: no abnormalities /neg HPV DNA  Hx of STDs: none  Last mammogram: 12/21/18  in [place], Results were no abnormalities, repeat in 12 months.  There is a FH of breast cancer in her pat aunt, age unknown. There is no FH of ovarian cancer. The patient does do self-breast exams.  Tobacco use: The patient denies current or previous tobacco use. Alcohol use: social drinker No drug use.  Exercise: moderately active  She does get adequate calcium and Vitamin D in her diet.   Past Medical History:  Diagnosis Date  . Diffuse cystic mastopathy   . Lump or mass in breast 2012   Right breast    Past Surgical History:  Procedure Laterality Date  . [place] gland  2003   I&D  . breast benign lumps removed  Left 2016   . BREAST SURGERY Bilateral 02/2015   breast augmentation with implants. Dr Lanny Cramp  . BREAST SURGERY Bilateral March 2017   fat transfer  . LASIK  2003    Family History  Problem Relation Age of Onset  . Breast cancer Paternal Aunt        ? age; no contact with dads family  . Pulmonary embolism Paternal Aunt   . Colon cancer Other        Maternal Great Aunt  . Bipolar disorder Mother   . Depression Mother   . Pulmonary embolism Mother   . Hypertension Father   . Diabetes Maternal Grandmother   . Diabetes Maternal Grandfather   . Heart disease Maternal Grandfather   . Stroke Maternal Grandfather   . Colon cancer Maternal Aunt     Social History   Socioeconomic History  . Marital status: Married    Spouse  name: Not on file  . Number of children: Not on file  . Years of education: Not on file  . Highest education level: Not on file  Occupational History  . Not on file  Tobacco Use  . Smoking status: Never Smoker  . Smokeless tobacco: Never Used  Substance and Sexual Activity  . Alcohol use: Yes    Alcohol/week: 0.0 standard drinks  . Drug use: No  . Sexual activity: Yes    Birth control/protection: I.U.D.    Comment: Mirena  Other Topics Concern  . Not on file  Social History Narrative  . Not on file   Social Determinants of Health   Financial Resource Strain:   . Difficulty of Paying Living Expenses: Not on file  Food Insecurity:   . Worried About Charity fundraiser in the Last Year: Not on file  . Ran Out of Food in the Last Year: Not on file  Transportation Needs:   . Lack of Transportation (Medical): Not on file  . Lack of Transportation (Non-Medical): Not on file  Physical Activity:   . Days of Exercise per Week: Not on file  . Minutes of Exercise per Session: Not on file  Stress:   . Feeling of  Stress : Not on file  Social Connections:   . Frequency of Communication with Friends and Family: Not on file  . Frequency of Social Gatherings with Friends and Family: Not on file  . Attends Religious Services: Not on file  . Active Member of Clubs or Organizations: Not on file  . Attends Archivist Meetings: Not on file  . Marital Status: Not on file  Intimate Partner Violence:   . Fear of Current or Ex-Partner: Not on file  . Emotionally Abused: Not on file  . Physically Abused: Not on file  . Sexually Abused: Not on file     Current Outpatient Medications:  .  levonorgestrel (MIRENA) 20 MCG/24HR IUD, 1 each by Intrauterine route once. Approximate date, Disp: , Rfl:      ROS:  Review of Systems  Constitutional: Negative for fatigue, fever and unexpected weight change.  Respiratory: Negative for cough, shortness of breath and wheezing.    Cardiovascular: Negative for chest pain, palpitations and leg swelling.  Gastrointestinal: Negative for blood in stool, constipation, diarrhea, nausea and vomiting.  Endocrine: Negative for cold intolerance, heat intolerance and polyuria.  Genitourinary: Negative for dyspareunia, dysuria, flank pain, frequency, genital sores, hematuria, menstrual problem, pelvic pain, urgency, vaginal bleeding, vaginal discharge and vaginal pain.  Musculoskeletal: Negative for back pain, joint swelling and myalgias.  Skin: Negative for rash.  Neurological: Negative for dizziness, syncope, light-headedness, numbness and headaches.  Hematological: Negative for adenopathy.  Psychiatric/Behavioral: Negative for agitation, confusion, sleep disturbance and suicidal ideas. The patient is not nervous/anxious.   BREAST: No symptoms   Objective: BP 108/70   Ht 5\' 4"  (1.626 m)   Wt 123 lb (55.8 kg)   BMI 21.11 kg/m    Physical Exam Constitutional:      Appearance: She is well-developed.  Genitourinary:     Vulva, vagina, cervix, uterus, right adnexa and left adnexa normal.     No vulval lesion or tenderness noted.     No vaginal discharge, erythema or tenderness.     No cervical polyp.     IUD strings visualized.     Uterus is not enlarged or tender.     No right or left adnexal mass present.     Right adnexa not tender.     Left adnexa not tender.  Neck:     Thyroid: No thyromegaly.  Cardiovascular:     Rate and Rhythm: Normal rate and regular rhythm.     Heart sounds: Normal heart sounds. No murmur.  Pulmonary:     Effort: Pulmonary effort is normal.     Breath sounds: Normal breath sounds.  Chest:     Breasts:        Right: No mass, nipple discharge, skin change or tenderness.        Left: No mass, nipple discharge, skin change or tenderness.  Abdominal:     Palpations: Abdomen is soft.     Tenderness: There is no abdominal tenderness. There is no guarding.  Musculoskeletal:        General:  Normal range of motion.     Cervical back: Normal range of motion.  Neurological:     General: No focal deficit present.     Mental Status: She is alert and oriented to person, place, and time.     Cranial Nerves: No cranial nerve deficit.  Skin:    General: Skin is warm and dry.  Psychiatric:        Mood and Affect: Mood  normal.        Behavior: Behavior normal.        Thought Content: Thought content normal.        Judgment: Judgment normal.  Vitals reviewed.     Assessment/Plan: Encounter for annual routine gynecological examination  Cervical cancer screening - Plan: Cytology - PAP  Screening for HPV (human papillomavirus) - Plan: Cytology - PAP  Encounter for routine checking of intrauterine contraceptive device (IUD); IUD strings in cx os  Encounter for screening mammogram for malignant neoplasm of breast - Plan: MM 3D SCREEN BREAST BILATERAL; pt to sched mammo            GYN counsel breast self exam, mammography screening, adequate intake of calcium and vitamin D, diet and exercise     F/U  Return in about 1 year (around 01/01/2021).  Brallan Denio B. Eliza Green, PA-C 01/02/2020 2:13 PM   01/10/20 NOTE ADDENDED AFTER REVIEW OF PT'S MEDICAL RECORDS FROM [place] (ARRIVED 01/09/20). ABC, PA-C

## 2020-01-02 NOTE — Patient Instructions (Addendum)
I value your feedback and entrusting us with your care. If you get a [facility] patient survey, I would appreciate you taking the time to let us know about your experience today. Thank you!  As of October 06, 2019, your lab results will be released to your MyChart immediately, before I even have a chance to see them. Please give me time to review them and contact you if there are any abnormalities. Thank you for your patience.   [facility]: [phone]  [facility]: [phone]  

## 2020-01-04 LAB — CYTOLOGY - PAP
Comment: NEGATIVE
Diagnosis: NEGATIVE
High risk HPV: NEGATIVE

## 2020-01-09 DIAGNOSIS — Z1389 Encounter for screening for other disorder: Secondary | ICD-10-CM | POA: Diagnosis not present

## 2020-01-09 DIAGNOSIS — Z13220 Encounter for screening for lipoid disorders: Secondary | ICD-10-CM | POA: Diagnosis not present

## 2020-01-09 DIAGNOSIS — Z0000 Encounter for general adult medical examination without abnormal findings: Secondary | ICD-10-CM | POA: Diagnosis not present

## 2020-01-10 ENCOUNTER — Encounter: Payer: Self-pay | Admitting: Obstetrics and Gynecology

## 2020-01-16 DIAGNOSIS — R0602 Shortness of breath: Secondary | ICD-10-CM | POA: Diagnosis not present

## 2020-02-06 ENCOUNTER — Other Ambulatory Visit
Admission: RE | Admit: 2020-02-06 | Discharge: 2020-02-06 | Disposition: A | Discharge status: Home / Self Care | Payer: BC Managed Care – PPO | Source: Ambulatory Visit | Attending: Pulmonary Disease | Admitting: Pulmonary Disease

## 2020-02-06 DIAGNOSIS — R0600 Dyspnea, unspecified: Secondary | ICD-10-CM | POA: Diagnosis not present

## 2020-02-06 LAB — FIBRIN DERIVATIVES D-DIMER (ARMC ONLY): Fibrin derivatives D-dimer (ARMC): 181.67 ng/mL (FEU) (ref 0.00–499.00)

## 2020-02-08 ENCOUNTER — Ambulatory Visit
Admission: RE | Admit: 2020-02-08 | Discharge: 2020-02-08 | Disposition: A | Discharge status: Home / Self Care | Payer: BC Managed Care – PPO | Source: Ambulatory Visit | Attending: Obstetrics and Gynecology | Admitting: Obstetrics and Gynecology

## 2020-02-08 ENCOUNTER — Other Ambulatory Visit: Payer: Self-pay | Admitting: Obstetrics and Gynecology

## 2020-02-08 DIAGNOSIS — Z1231 Encounter for screening mammogram for malignant neoplasm of breast: Secondary | ICD-10-CM | POA: Insufficient documentation

## 2020-02-15 ENCOUNTER — Encounter: Payer: Self-pay | Admitting: Obstetrics and Gynecology

## 2020-05-20 ENCOUNTER — Encounter: Payer: Self-pay | Admitting: Obstetrics and Gynecology

## 2021-02-21 ENCOUNTER — Other Ambulatory Visit: Payer: Self-pay

## 2021-02-21 ENCOUNTER — Encounter: Payer: Self-pay | Admitting: Dermatology

## 2021-02-21 ENCOUNTER — Ambulatory Visit: Payer: BC Managed Care – PPO | Admitting: Dermatology

## 2021-02-21 DIAGNOSIS — L814 Other melanin hyperpigmentation: Secondary | ICD-10-CM | POA: Diagnosis not present

## 2021-02-21 DIAGNOSIS — L578 Other skin changes due to chronic exposure to nonionizing radiation: Secondary | ICD-10-CM | POA: Diagnosis not present

## 2021-02-21 DIAGNOSIS — B078 Other viral warts: Secondary | ICD-10-CM | POA: Diagnosis not present

## 2021-02-21 DIAGNOSIS — D229 Melanocytic nevi, unspecified: Secondary | ICD-10-CM

## 2021-02-21 DIAGNOSIS — L988 Other specified disorders of the skin and subcutaneous tissue: Secondary | ICD-10-CM | POA: Diagnosis not present

## 2021-02-21 DIAGNOSIS — Z1283 Encounter for screening for malignant neoplasm of skin: Secondary | ICD-10-CM

## 2021-02-21 DIAGNOSIS — L821 Other seborrheic keratosis: Secondary | ICD-10-CM

## 2021-02-21 DIAGNOSIS — D1800 Hemangioma unspecified site: Secondary | ICD-10-CM

## 2021-02-21 NOTE — Progress Notes (Signed)
New Patient Visit  Subjective  Amanda Graham is a 45 y.o. female who presents for the following: FBSE (Patient here for full body skin exam and skin cancer screening. No hx of skin cancer. Patient not aware of any new or changing spots. ).  Patient unsure if any fhx of skin cancer. She has a wart at the right foot she would like treated.  The following portions of the chart were reviewed this encounter and updated as appropriate:   Tobacco  Allergies  Meds  Problems  Med Hx  Surg Hx  Fam Hx      Review of Systems:  No other skin or systemic complaints except as noted in HPI or Assessment and Plan.  Objective  Well appearing patient in no apparent distress; mood and affect are within normal limits.  A full examination was performed including scalp, head, eyes, ears, nose, lips, neck, chest, axillae, abdomen, back, buttocks, bilateral upper extremities, bilateral lower extremities, hands, feet, fingers, toes, fingernails, and toenails. All findings within normal limits unless otherwise noted below.  Objective  Right plantar Foot (2): Verrucous papules -- Discussed viral etiology and contagion.    Assessment & Plan  Other viral warts (2) Right plantar Foot  Superior site very small, nearly clear after podiatry treatment but with 1 focus recurring  Squaric Acid 3% applied to warts today. Prior to application reviewed risk of inflammation and irritation.  Cantharidin is a blistering agent that comes from a beetle.  It needs to be washed off in about 4 hours after application.  Although it is painless when applied in office, it may cause symptoms of mild pain and burning several hours later.  Treated areas will swell and turn red, and blisters may form.  Vaseline and a bandaid may be applied until wound has healed.  Once healed, the skin may remain temporarily discolored.  It can take weeks to months for pigmentation to return to normal.    Discussed viral etiology and risk of  spread.  Discussed multiple treatments may be required to clear warts.  Discussed possible post-treatment dyspigmentation and risk of recurrence.  Will send in No Name (5-fluorouracil/salicylic acid) Prescription for patient to use for any remaining wart once it is completely healed from treatment.  Destruction of lesion - Right plantar Foot  Destruction method: chemical removal   Destruction method comment:  Cantharidin plus, squaric acid 3$=% Informed consent: discussed and consent obtained   Timeout:  patient name, date of birth, surgical site, and procedure verified Chemical destruction method: cantharidin   Chemical destruction method comment:  Squaric acid 3% Application time:  4 hours Procedure instructions: patient instructed to wash and dry area   Outcome: patient tolerated procedure well with no complications   Post-procedure details: wound care instructions given    Elastosis of skin Mid Forehead  Will prescribe [facility] Anti-Aging Tretinoin 0.025%/Niacinamide/Vitamin C/Vitamin E/Turmeric/Resveratrol with Hyaluronic Acid. Apply pea sized amount nightly to the entire face.  The patient was advised this is not covered by insurance since it is made by a compounding pharmacy. They will receive an email to check out and the medication will be mailed to their home.   Topical retinoid medications like tretinoin can cause dryness and irritation when first started. Only apply a pea-sized amount to the entire affected area. Avoid applying it around the eyes, edges of mouth and creases at the nose. If you experience irritation, use a good moisturizer first and/or apply the medicine less often. If you  are doing well with the medicine, you can increase how often you use it until you are applying every night. Be careful with sun protection while using this medication as it can make you sensitive to the sun. This medicine should not be used by pregnant women.     Lentigines - Scattered  tan macules - Due to sun exposure - Benign-appering, observe - Recommend daily broad spectrum sunscreen SPF 30+ to sun-exposed areas, reapply every 2 hours as needed. - Call for any changes  Seborrheic Keratoses - Stuck-on, waxy, tan-brown papules and/or plaques  - Benign-appearing - Discussed benign etiology and prognosis. - Observe - Call for any changes  Melanocytic Nevi - Tan-brown and/or pink-flesh-colored symmetric macules and papules - Benign appearing on exam today - Observation - Call clinic for new or changing moles - Recommend daily use of broad spectrum spf 30+ sunscreen to sun-exposed areas.   Hemangiomas - Red papules - Discussed benign nature - Observe - Call for any changes  Actinic Damage - Chronic condition, secondary to cumulative UV/sun exposure - diffuse scaly erythematous macules with underlying dyspigmentation - Recommend daily broad spectrum sunscreen SPF 30+ to sun-exposed areas, reapply every 2 hours as needed.  - Staying in the shade or wearing long sleeves, sun glasses (UVA+UVB protection) and wide brim hats (4-inch brim around the entire circumference of the hat) are also recommended for sun protection.  - Call for new or changing lesions.  Skin cancer screening performed today.   Return if symptoms worsen or fail to improve.  Graciella Belton, RMA, am acting as scribe for Forest Gleason, MD .   Documentation: I have reviewed the above documentation for accuracy and completeness, and I agree with the above.  Forest Gleason, MD

## 2021-02-21 NOTE — Patient Instructions (Addendum)
Melanoma ABCDEs  Melanoma is the most dangerous type of skin cancer, and is the leading cause of death from skin disease.  You are more likely to develop melanoma if you:  Have light-colored skin, light-colored eyes, or red or blond hair  Spend a lot of time in the sun  Tan regularly, either outdoors or in a tanning bed  Have had blistering sunburns, especially during childhood  Have a close family member who has had a melanoma  Have atypical moles or large birthmarks  Early detection of melanoma is key since treatment is typically straightforward and cure rates are extremely high if we catch it early.   The first sign of melanoma is often a change in a mole or a new dark spot.  The ABCDE system is a way of remembering the signs of melanoma.  A for asymmetry:  The two halves do not match. B for border:  The edges of the growth are irregular. C for color:  A mixture of colors are present instead of an even brown color. D for diameter:  Melanomas are usually (but not always) greater than 51mm - the size of a pencil eraser. E for evolution:  The spot keeps changing in size, shape, and color.  Please check your skin once per month between visits. You can use a small mirror in front and a large mirror behind you to keep an eye on the back side or your body.   If you see any new or changing lesions before your next follow-up, please call to schedule a visit.  Please continue daily skin protection including broad spectrum sunscreen SPF 30+ to sun-exposed areas, reapplying every 2 hours as needed when you're outdoors.   Recommend taking Heliocare sun protection supplement daily in sunny weather for additional sun protection. For maximum protection on the sunniest days, you can take up to 2 capsules of regular Heliocare OR take 1 capsule of Heliocare Ultra. For prolonged exposure (such as a full day in the sun), you can repeat your dose of the supplement 4 hours after your first dose. Heliocare  can be purchased at [facility] or at [email or web address].   If you have any questions or concerns for your doctor, please call our main line at [phone] and press option 4 to reach your doctor's medical assistant. If no one answers, please leave a voicemail as directed and we will return your call as soon as possible. Messages left after 4 pm will be answered the following business day.   You may also send [place] a message via [place]. We typically respond to MyChart messages within 1-2 business days.  For prescription refills, please ask your pharmacy to contact our office. Our fax number is [phone].  If you have an urgent issue when the clinic is closed that cannot wait until the next business day, you can page your doctor at the number below.    Please note that while we do our best to be available for urgent issues outside of office hours, we are not available 24/7.   If you have an urgent issue and are unable to reach [place], you may choose to seek medical care at your doctor's office, retail clinic, urgent care center, or emergency room.  If you have a medical emergency, please immediately call 911 or go to the emergency department.  Pager Numbers  - Dr. Nehemiah Massed: [phone]  - Dr. Laurence Ferrari: [phone]  - Dr. Nicole Kindred: [phone]  In the event of inclement weather, please  call our main line at [phone] for an update on the status of any delays or closures.  Dermatology Medication Tips: Please keep the boxes that topical medications come in in order to help keep track of the instructions about where and how to use these. Pharmacies typically print the medication instructions only on the boxes and not directly on the medication tubes.   If your medication is too expensive, please contact our office at [phone] option 4 or send [place] a message through [place].   We are unable to tell what your co-pay for medications will be in advance as this is different depending on  your insurance coverage. However, we may be able to find a substitute medication at lower cost or fill out paperwork to get insurance to cover a needed medication.   If a prior authorization is required to get your medication covered by your insurance company, please allow [place] 1-2 business days to complete this process.  Drug prices often vary depending on where the prescription is filled and some pharmacies may offer cheaper prices.  The website www.goodrx.com contains coupons for medications through different pharmacies. The prices here do not account for what the cost may be with help from insurance (it may be cheaper with your insurance), but the website can give you the price if you did not use any insurance.  - You can print the associated coupon and take it with your prescription to the pharmacy.  - You may also stop by our office during regular business hours and pick up a GoodRx coupon card.  - If you need your prescription sent electronically to a different pharmacy, notify our office through [facility] or by phone at [phone] option 4.  Instructions for After In-Office Application of Cantharidin  1. This is a strong medicine; please follow ALL instructions.  2. Gently wash off with soap and water in four hours or sooner s directed by your physician.  3. **WARNING** this medicine can cause severe blistering, blood blisters, infection, and/or scarring if it is not washed off as directed.  4. Your progress will be rechecked in 1-2 months; call sooner if there are any questions or problems.  Cantharidin is a blistering agent that comes from a beetle.  It needs to be washed off in about 4 hours after application.  Although it is painless when applied in office, it may cause symptoms of mild pain and burning several hours later.  Treated areas will swell and turn red, and blisters may form.  Vaseline and a bandaid may be applied until wound has healed.  Once healed, the skin may  remain temporarily discolored.  It can take weeks to months for pigmentation to return to normal.    Discussed viral etiology and risk of spread.  Discussed multiple treatments may be required to clear warts.  Discussed possible post-treatment dyspigmentation and risk of recurrence.  WartPEEL is a special compounded prescription medicine (5-fluorouracil/salicylic acid) used to treat warts. It works much faster than over the counter salicylic acid but is not paid for by insurance. It should be applied to the wart once a day and covered with a bandage. Care should be taken to avoid applying it to the normal skin surrounding the wart in order to avoid irritation. It should not be used by pregnant women.    WARTPEEL INSTRUCTIONS  WartPEEL is a medicine to treat warts that has been prescribed for you and can be filled only at a special compounding pharmacy. Please  call the pharmacy below to fill your prescription. Once they confirm your address and payment, they will mail you the medicine. DO NOT USE THIS MEDICINE IF YOU ARE PREGNANT OR THINKING OF BECOMING PREGNANT.  [facility] Ph: [phone]   Must be dispensed in amber syringe to ensure quality of medication. PRIOR TO USING THIS MEDICATION, IT IS IMPORTANT TO INFORM YOUR PHYSICIAN IF YOU ARE PREGNANT OR THINKING OF BECOMING PREGNANT. **This medication was custom compounded for you based on the prescription orders of your physician.  How to use this medication: 1. Apply medication at bedtime. 2. Apply very small amount of medication to a flat plastic applicator. Use the applicator to apply a thin layer directly onto the wart. Use care to avoid applying the medicine to healthy skin. The medicine will break down healthy skin as well as the warts. 3. Cover the wart with the tape provided.  4. Put the cap back tightly on the syringe. 5. Wash hands after applying the medicine. NEVER put the WartPEEL in the mouth, nose or  eyes. 6. Remove the occlusion in the morning and wash the area thoroughly.  7. In case of accidental ingestion or contact with the eye, nose or mouth, contact [place] or the local poison control.   What to expect: During the first few days of application the skin around the wart may swell and become white. This will slough off with continued applications. Normal Dosage: The medication is applied once daily for a time determined by your physician. Storage Requirements: Store this medication at room temperature. Keep out of reach of children. PROTECT FROM LIGHT. Expiration Date: The medication is good for four months from the date made. Do not keep outdated medication. Side effects: Rash and irritation, if medication is applied to good skin. Cautions and Warnings: Only apply the medication to the warts. Do not apply to good skin. Keep away from children. Do not use on nose, eyes or the mouth.    Instructions for [facility] Medications  One or more of your medications was sent to the [facility] mail order compounding pharmacy. You will receive an email from them and can purchase the medicine through that link. It will then be mailed to your home at the address you confirmed. If for any reason you do not receive an email from them, please check your spam folder. If you still do not find the email, please let us know. [facility] phone number is [phone].  Topical retinoid medications like tretinoin/Retin-A, adapalene/Differin, tazarotene/Fabior, and Epiduo/Epiduo Forte can cause dryness and irritation when first started. Only apply a pea-sized amount to the entire affected area. Avoid applying it around the eyes, edges of mouth and creases at the nose. If you experience irritation, use a good moisturizer first and/or apply the medicine less often. If you are doing well with the medicine, you can increase how often you use it until you are applying every night. Be careful  with sun protection while using this medication as it can make you sensitive to the sun. This medicine should not be used by pregnant women.

## 2021-03-20 ENCOUNTER — Other Ambulatory Visit: Payer: Self-pay | Admitting: Family Medicine

## 2021-03-20 DIAGNOSIS — Z1231 Encounter for screening mammogram for malignant neoplasm of breast: Secondary | ICD-10-CM

## 2021-03-27 ENCOUNTER — Other Ambulatory Visit: Payer: Self-pay

## 2021-03-27 ENCOUNTER — Ambulatory Visit
Admission: RE | Admit: 2021-03-27 | Discharge: 2021-03-27 | Disposition: A | Discharge status: Home / Self Care | Payer: BC Managed Care – PPO | Source: Ambulatory Visit | Attending: Family Medicine | Admitting: Family Medicine

## 2021-03-27 DIAGNOSIS — Z1231 Encounter for screening mammogram for malignant neoplasm of breast: Secondary | ICD-10-CM | POA: Diagnosis present

## 2021-03-28 NOTE — Progress Notes (Signed)
PCP:  Maryland Pink, MD   Chief Complaint  Patient presents with  . Gynecologic Exam     HPI:      Ms. Amanda Graham is a 45 y.o. G4P4 who LMP was No LMP recorded. (Menstrual status: IUD)., presents today for her annual examination.  Her menses are absent with IUD except occas spotting.  Dysmenorrhea none.   Sex activity: single partner, contraception - IUD. Mirena placed 12/20/2018 in [place]. No pain/bleeding with sex, but occas notices blood with wiping next AM, sometimes after BM. Not sure if it's rectal bleeding because doesn't have it otherwise. Last Pap: 01/02/20 Results were: no abnormalities /neg HPV DNA  Hx of STDs: none  Last mammogram: 03/27/21, Results were no abnormalities, repeat in 12 months.  There is a FH of breast cancer in her pat aunt, age unknown. There is no FH of ovarian cancer. The patient does do self-breast exams.  Tobacco use: The patient denies current or previous tobacco use. Alcohol use: social drinker No drug use.  Exercise: moderately active  She does get adequate calcium and Vitamin D in her diet.   Past Medical History:  Diagnosis Date  . Diffuse cystic mastopathy   . Lump or mass in breast 2012   Right breast    Past Surgical History:  Procedure Laterality Date  . AUGMENTATION MAMMAPLASTY Bilateral 02/2015  . [place] gland  2003   I&D  . breast benign lumps removed  Left 2016   . BREAST SURGERY Bilateral 02/2015   breast augmentation with implants. Dr Lanny Cramp  . BREAST SURGERY Bilateral March 2017   fat transfer  . LASIK  2003    Family History  Problem Relation Age of Onset  . Breast cancer Paternal Aunt        ? age; no contact with dads family  . Pulmonary embolism Paternal Aunt   . Colon cancer Other        Maternal Great Aunt  . Bipolar disorder Mother   . Depression Mother   . Pulmonary embolism Mother   . Hypertension Father   . Diabetes Maternal Grandmother   . Diabetes Maternal Grandfather   . Heart disease Maternal  Grandfather   . Stroke Maternal Grandfather   . Colon cancer Maternal Aunt     Social History   Socioeconomic History  . Marital status: Married    Spouse name: Not on file  . Number of children: Not on file  . Years of education: Not on file  . Highest education level: Not on file  Occupational History  . Not on file  Tobacco Use  . Smoking status: Never Smoker  . Smokeless tobacco: Never Used  Vaping Use  . Vaping Use: Never used  Substance and Sexual Activity  . Alcohol use: Yes    Alcohol/week: 0.0 standard drinks  . Drug use: No  . Sexual activity: Yes    Birth control/protection: I.U.D.    Comment: Mirena  Other Topics Concern  . Not on file  Social History Narrative  . Not on file   Social Determinants of Health   Financial Resource Strain: Not on file  Food Insecurity: Not on file  Transportation Needs: Not on file  Physical Activity: Not on file  Stress: Not on file  Social Connections: Not on file  Intimate Partner Violence: Not on file     Current Outpatient Medications:  .  ascorbic acid (VITAMIN C) 1000 MG tablet, Take by mouth., Disp: , Rfl:  .  Cholecalciferol 50 MCG (2000 UT) CAPS, Take by mouth., Disp: , Rfl:  .  levonorgestrel (MIRENA) 20 MCG/24HR IUD, 1 each by Intrauterine route once. Approximate date, Disp: , Rfl:      ROS:  Review of Systems  Constitutional: Negative for fatigue, fever and unexpected weight change.  Respiratory: Negative for cough, shortness of breath and wheezing.   Cardiovascular: Negative for chest pain, palpitations and leg swelling.  Gastrointestinal: Negative for blood in stool, constipation, diarrhea, nausea and vomiting.  Endocrine: Negative for cold intolerance, heat intolerance and polyuria.  Genitourinary: Negative for dyspareunia, dysuria, flank pain, frequency, genital sores, hematuria, menstrual problem, pelvic pain, urgency, vaginal bleeding, vaginal discharge and vaginal pain.  Musculoskeletal:  Negative for back pain, joint swelling and myalgias.  Skin: Negative for rash.  Neurological: Negative for dizziness, syncope, light-headedness, numbness and headaches.  Hematological: Negative for adenopathy.  Psychiatric/Behavioral: Negative for agitation, confusion, sleep disturbance and suicidal ideas. The patient is not nervous/anxious.   BREAST: No symptoms   Objective: BP 100/64   Ht 5\' 3"  (1.6 m)   Wt 118 lb (53.5 kg)   BMI 20.90 kg/m    Physical Exam Constitutional:      Appearance: She is well-developed.  Genitourinary:     Vulva normal.     Right Labia: No rash, tenderness or lesions.    Left Labia: No tenderness, lesions or rash.    No vaginal discharge, erythema or tenderness.      Right Adnexa: not tender and no mass present.    Left Adnexa: not tender and no mass present.    No cervical friability or polyp.     IUD strings visualized.     Uterus is not enlarged or tender.  Breasts:     Right: No mass, nipple discharge, skin change or tenderness.     Left: No mass, nipple discharge, skin change or tenderness.    Neck:     Thyroid: No thyromegaly.  Cardiovascular:     Rate and Rhythm: Normal rate and regular rhythm.     Heart sounds: Normal heart sounds. No murmur heard.   Pulmonary:     Effort: Pulmonary effort is normal.     Breath sounds: Normal breath sounds.  Abdominal:     Palpations: Abdomen is soft.     Tenderness: There is no abdominal tenderness. There is no guarding or rebound.  Musculoskeletal:        General: Normal range of motion.     Cervical back: Normal range of motion.  Lymphadenopathy:     Cervical: No cervical adenopathy.  Neurological:     General: No focal deficit present.     Mental Status: She is alert and oriented to person, place, and time.     Cranial Nerves: No cranial nerve deficit.  Skin:    General: Skin is warm and dry.  Psychiatric:        Mood and Affect: Mood normal.        Behavior: Behavior normal.         Thought Content: Thought content normal.        Judgment: Judgment normal.  Vitals reviewed.     Assessment/Plan: Encounter for annual routine gynecological examination  Encounter for routine checking of intrauterine contraceptive device (IUD); IUD strings in cx os; due for removal 2/27  Encounter for screening mammogram for malignant neoplasm of breast - Plan: MM 3D SCREEN BREAST BILATERAL; pt is current on mammo  GYN counsel breast self exam, mammography screening, adequate intake of calcium and vitamin D, diet and exercise     F/U  Return in about 1 year (around 04/02/2022).  Amanda Dimperio B. Casmer Yepiz, PA-C 04/02/2021 8:48 AM   01/10/20 NOTE ADDENDED AFTER REVIEW OF PT'S MEDICAL RECORDS FROM PA (ARRIVED 01/09/20). ABC, PA-C

## 2021-03-28 NOTE — Patient Instructions (Signed)
I value your feedback and you entrusting us with your care. If you get a [facility] patient survey, I would appreciate you taking the time to let us know about your experience today. Thank you! ? ? ?

## 2021-04-02 ENCOUNTER — Ambulatory Visit (INDEPENDENT_AMBULATORY_CARE_PROVIDER_SITE_OTHER): Payer: BC Managed Care – PPO | Admitting: Obstetrics and Gynecology

## 2021-04-02 ENCOUNTER — Encounter: Payer: Self-pay | Admitting: Obstetrics and Gynecology

## 2021-04-02 ENCOUNTER — Other Ambulatory Visit: Payer: Self-pay

## 2021-04-02 VITALS — BP 100/64 | Ht 63.0 in | Wt 118.0 lb

## 2021-04-02 DIAGNOSIS — Z01419 Encounter for gynecological examination (general) (routine) without abnormal findings: Secondary | ICD-10-CM

## 2021-04-02 DIAGNOSIS — Z30431 Encounter for routine checking of intrauterine contraceptive device: Secondary | ICD-10-CM | POA: Diagnosis not present

## 2021-04-02 DIAGNOSIS — Z1231 Encounter for screening mammogram for malignant neoplasm of breast: Secondary | ICD-10-CM

## 2022-03-13 ENCOUNTER — Other Ambulatory Visit: Payer: Self-pay | Admitting: Family Medicine

## 2022-03-13 DIAGNOSIS — Z1231 Encounter for screening mammogram for malignant neoplasm of breast: Secondary | ICD-10-CM

## 2022-04-22 ENCOUNTER — Ambulatory Visit
Admission: RE | Admit: 2022-04-22 | Discharge: 2022-04-22 | Disposition: A | Discharge status: Home / Self Care | Payer: BC Managed Care – PPO | Source: Ambulatory Visit | Attending: Family Medicine | Admitting: Family Medicine

## 2022-04-22 DIAGNOSIS — Z1231 Encounter for screening mammogram for malignant neoplasm of breast: Secondary | ICD-10-CM | POA: Diagnosis present

## 2022-04-25 ENCOUNTER — Other Ambulatory Visit: Payer: Self-pay | Admitting: Family Medicine

## 2022-04-25 DIAGNOSIS — R928 Other abnormal and inconclusive findings on diagnostic imaging of breast: Secondary | ICD-10-CM

## 2022-04-25 DIAGNOSIS — N6489 Other specified disorders of breast: Secondary | ICD-10-CM

## 2022-05-19 ENCOUNTER — Ambulatory Visit
Admission: RE | Admit: 2022-05-19 | Discharge: 2022-05-19 | Disposition: A | Discharge status: Home / Self Care | Payer: BC Managed Care – PPO | Source: Ambulatory Visit | Attending: Family Medicine | Admitting: Family Medicine

## 2022-05-19 DIAGNOSIS — N6489 Other specified disorders of breast: Secondary | ICD-10-CM | POA: Diagnosis present

## 2022-05-19 DIAGNOSIS — R928 Other abnormal and inconclusive findings on diagnostic imaging of breast: Secondary | ICD-10-CM

## 2023-03-05 ENCOUNTER — Telehealth: Payer: Self-pay | Admitting: Obstetrics and Gynecology

## 2023-03-05 NOTE — Telephone Encounter (Signed)
Left message for patient to call office back to get her annual scheduled with [name].

## 2023-03-06 NOTE — Telephone Encounter (Signed)
I contacted the patient via phone to scheduled. The patient has transfer care to [facility]. The patient declined scheduling.

## 2023-04-06 ENCOUNTER — Other Ambulatory Visit: Payer: Self-pay | Admitting: Family Medicine

## 2023-04-06 DIAGNOSIS — Z1231 Encounter for screening mammogram for malignant neoplasm of breast: Secondary | ICD-10-CM

## 2023-04-28 ENCOUNTER — Other Ambulatory Visit: Payer: Self-pay | Admitting: Family Medicine

## 2023-04-28 ENCOUNTER — Ambulatory Visit
Admission: RE | Admit: 2023-04-28 | Discharge: 2023-04-28 | Disposition: A | Discharge status: Home / Self Care | Payer: BC Managed Care – PPO | Source: Ambulatory Visit | Attending: Family Medicine | Admitting: Family Medicine

## 2023-04-28 DIAGNOSIS — Z1231 Encounter for screening mammogram for malignant neoplasm of breast: Secondary | ICD-10-CM

## 2023-05-06 ENCOUNTER — Other Ambulatory Visit: Payer: Self-pay | Admitting: Family Medicine

## 2023-05-06 DIAGNOSIS — N6489 Other specified disorders of breast: Secondary | ICD-10-CM

## 2023-05-06 DIAGNOSIS — R928 Other abnormal and inconclusive findings on diagnostic imaging of breast: Secondary | ICD-10-CM

## 2023-05-08 ENCOUNTER — Ambulatory Visit
Admission: RE | Admit: 2023-05-08 | Discharge: 2023-05-08 | Disposition: A | Discharge status: Home / Self Care | Payer: BC Managed Care – PPO | Source: Ambulatory Visit | Attending: Family Medicine | Admitting: Family Medicine

## 2023-05-08 DIAGNOSIS — N6489 Other specified disorders of breast: Secondary | ICD-10-CM | POA: Diagnosis present

## 2023-05-08 DIAGNOSIS — R928 Other abnormal and inconclusive findings on diagnostic imaging of breast: Secondary | ICD-10-CM | POA: Diagnosis not present

## 2023-05-09 IMAGING — MG DIGITAL SCREENING BREAST BILAT IMPLANT W/ TOMO W/ CAD
9 of 15 series · 9 of 35 positions shown · non-contrast
Comparison: Previous exam(s).

CLINICAL DATA: Screening.

EXAM:
DIGITAL SCREENING BILATERAL MAMMOGRAM WITH IMPLANTS, CAD AND
TOMOSYNTHESIS
TECHNIQUE: Bilateral screening digital craniocaudal and mediolateral oblique
mammograms were obtained. Bilateral screening digital breast
tomosynthesis was performed. The images were evaluated with
computer-aided detection. Standard and/or implant displaced views
were performed.

[R MLO]
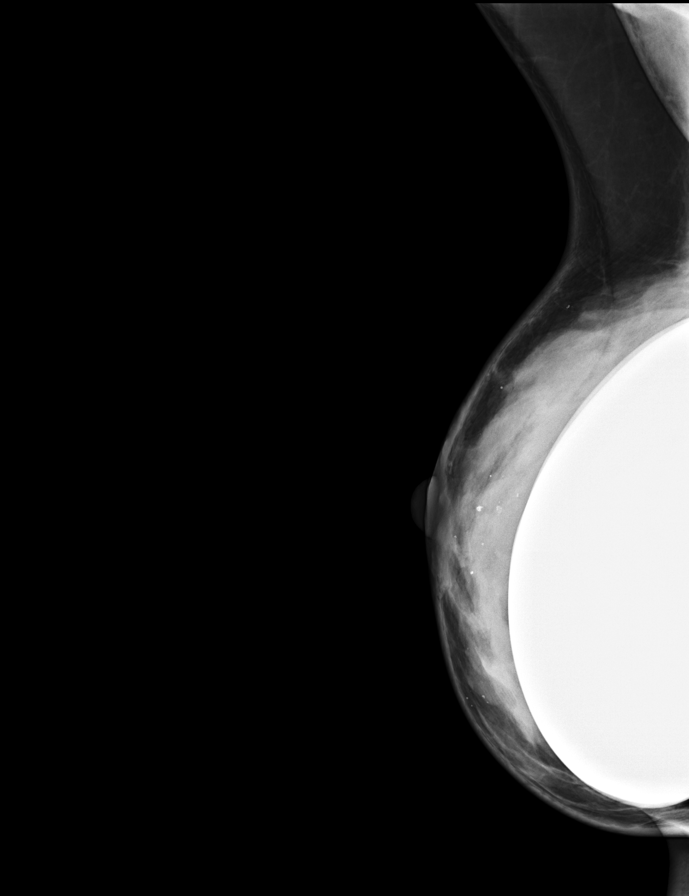

[R CC (1 of 2)]
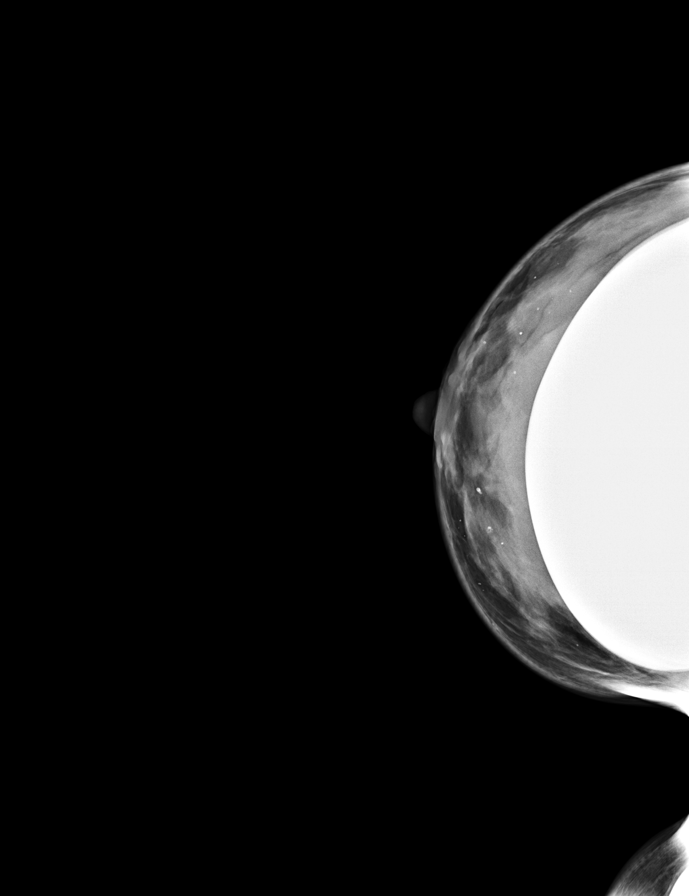

[L MLO]
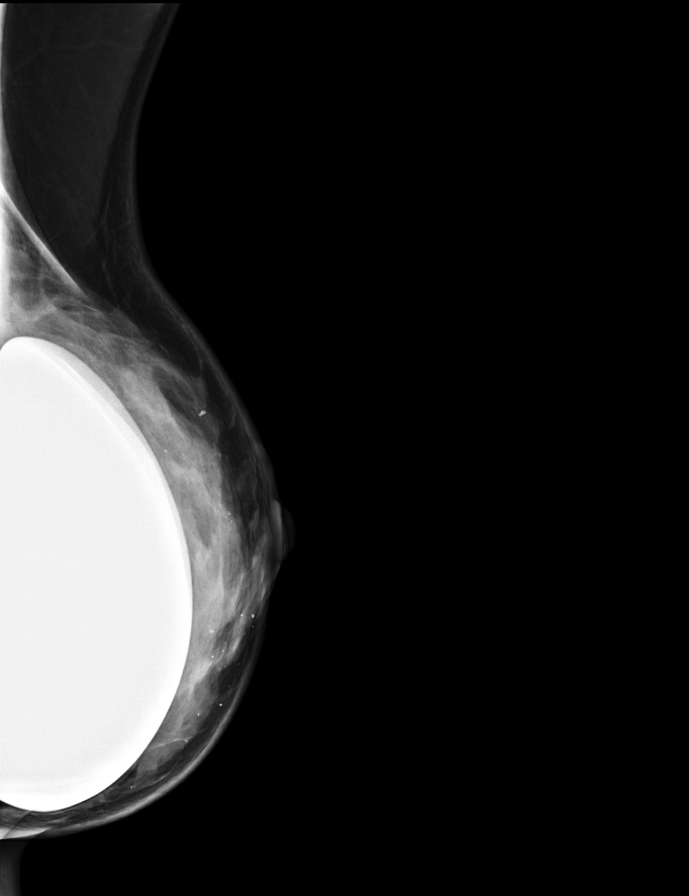

[L CC]
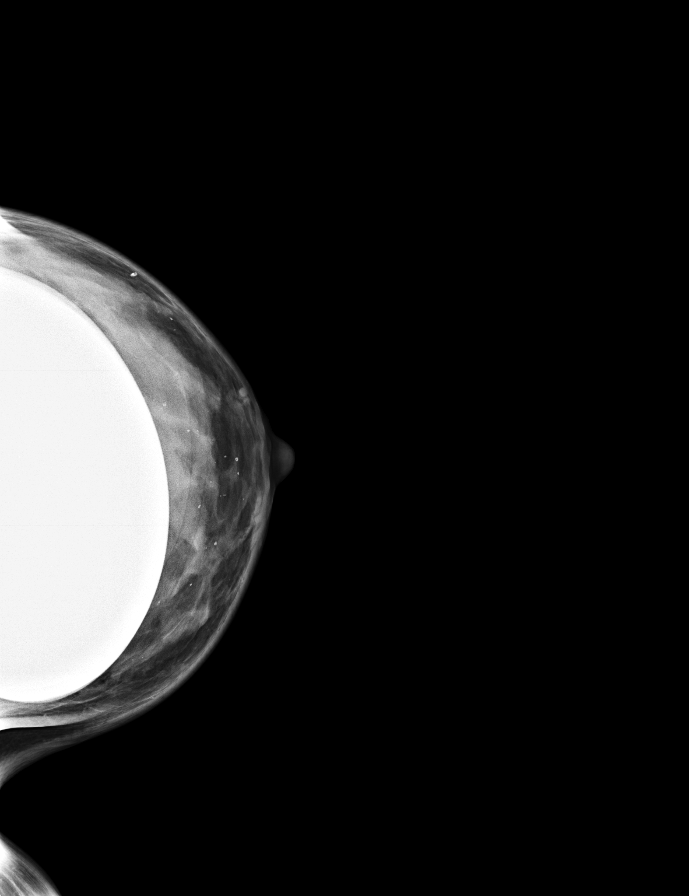

[R CC (2 of 2)]
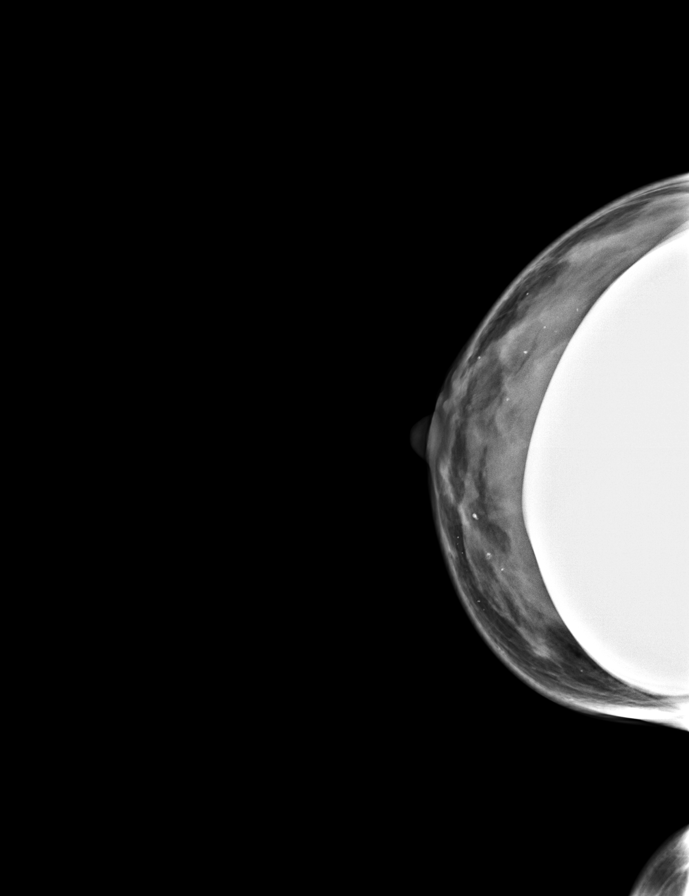

[L MLO synth-2D (1 of 2)]
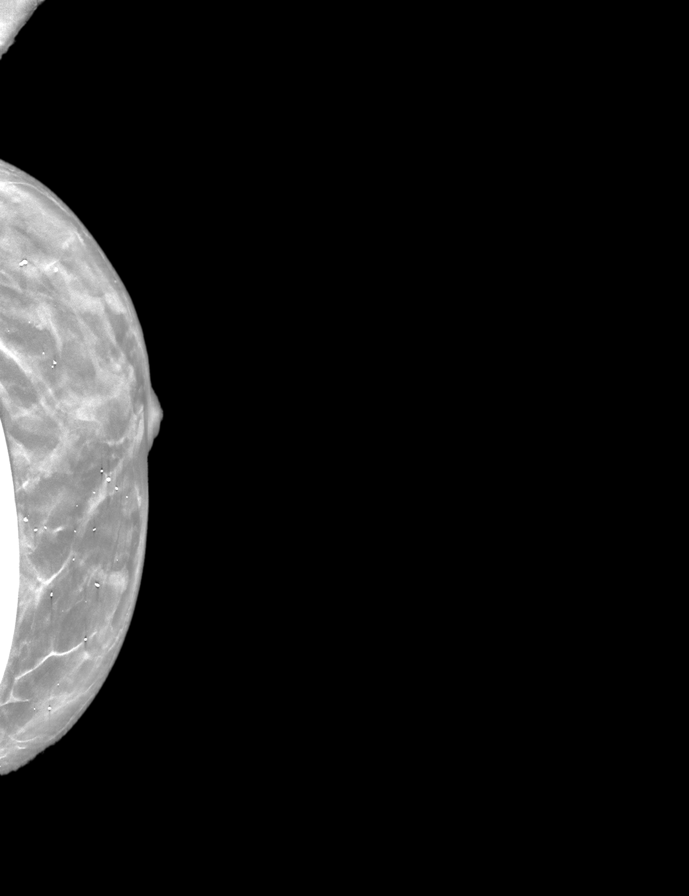

[L MLO synth-2D (2 of 2)]
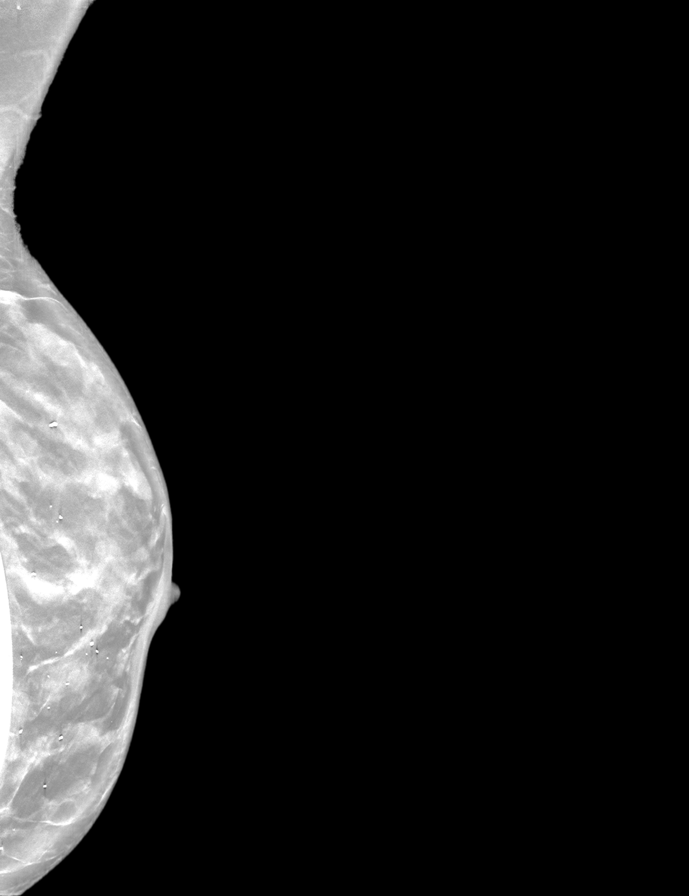

[R CC synth-2D]
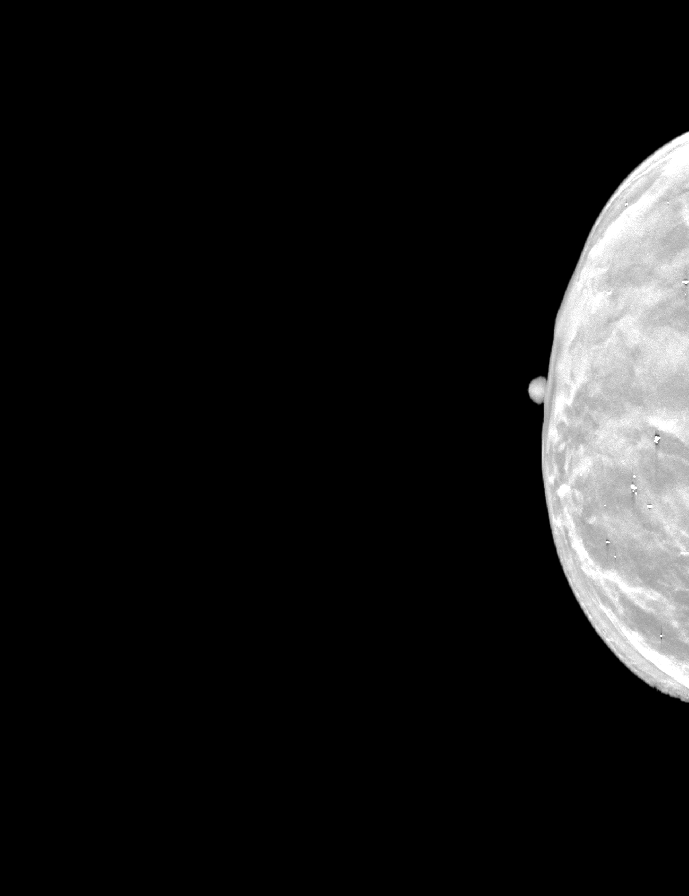

[R MLO synth-2D]
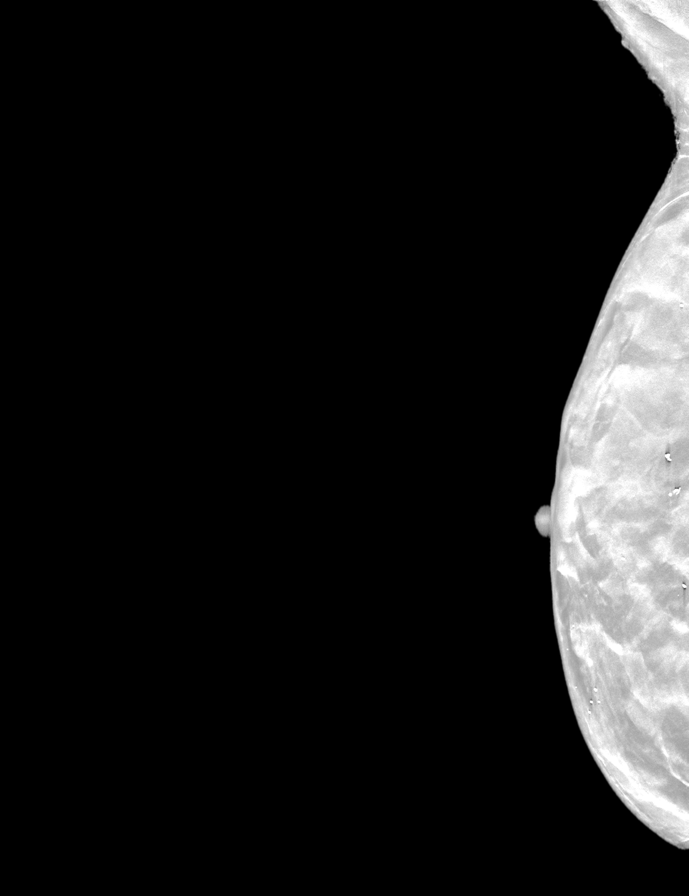

[9 of 35 positions shown; findings below may reference images not displayed]

ACR Breast Density Category d: The breast tissue is extremely dense,
which lowers the sensitivity of mammography.
FINDINGS: The patient has retropectoral implants. There are no findings
suspicious for malignancy.
IMPRESSION: No mammographic evidence of malignancy. A result letter of this
screening mammogram will be mailed directly to the patient.

RECOMMENDATION:
Screening mammogram in one year. (Code:GG-0-M5E)

BI-RADS CATEGORY  1:  Negative.

## 2023-11-03 ENCOUNTER — Encounter: Payer: Self-pay | Admitting: Family Medicine

## 2023-11-04 ENCOUNTER — Other Ambulatory Visit: Payer: Self-pay | Admitting: Family Medicine

## 2023-11-04 DIAGNOSIS — N6012 Diffuse cystic mastopathy of left breast: Secondary | ICD-10-CM

## 2023-12-14 ENCOUNTER — Ambulatory Visit
Admission: RE | Admit: 2023-12-14 | Discharge: 2023-12-14 | Disposition: A | Discharge status: Home / Self Care | Payer: BC Managed Care – PPO | Source: Ambulatory Visit | Attending: Family Medicine | Admitting: Family Medicine

## 2023-12-14 DIAGNOSIS — N6012 Diffuse cystic mastopathy of left breast: Secondary | ICD-10-CM | POA: Insufficient documentation

## 2024-04-26 ENCOUNTER — Encounter: Payer: Self-pay | Admitting: Family Medicine

## 2024-04-28 ENCOUNTER — Other Ambulatory Visit: Payer: Self-pay | Admitting: Family Medicine

## 2024-04-28 DIAGNOSIS — N6002 Solitary cyst of left breast: Secondary | ICD-10-CM

## 2024-06-09 ENCOUNTER — Ambulatory Visit
Admission: RE | Admit: 2024-06-09 | Discharge: 2024-06-09 | Disposition: A | Discharge status: Home / Self Care | Source: Ambulatory Visit | Attending: Family Medicine | Admitting: Family Medicine

## 2024-06-09 DIAGNOSIS — N6002 Solitary cyst of left breast: Secondary | ICD-10-CM | POA: Diagnosis present
